# Patient Record
Sex: Male | Born: 1955 | Race: White | Hispanic: No | Marital: Single | Smoking: Never smoker
Health system: Southern US, Community
[De-identification: ages and names within clinical notes are randomized; demographics above are authoritative.]

## PROBLEM LIST (undated history)

## (undated) DIAGNOSIS — I10 Essential (primary) hypertension: Secondary | ICD-10-CM

## (undated) DIAGNOSIS — K219 Gastro-esophageal reflux disease without esophagitis: Secondary | ICD-10-CM

## (undated) HISTORY — PX: COLONOSCOPY: SHX174

## (undated) HISTORY — PX: GANGLION CYST EXCISION: SHX1691

## (undated) HISTORY — PX: PROSTATE BIOPSY: SHX241

---

## 2004-04-13 ENCOUNTER — Ambulatory Visit (HOSPITAL_COMMUNITY): Admission: RE | Admit: 2004-04-13 | Discharge: 2004-04-13 | Payer: Self-pay | Admitting: Gastroenterology

## 2015-01-07 ENCOUNTER — Other Ambulatory Visit: Payer: Self-pay | Admitting: Gastroenterology

## 2015-02-18 ENCOUNTER — Encounter (HOSPITAL_COMMUNITY): Payer: Self-pay | Admitting: *Deleted

## 2015-03-02 ENCOUNTER — Ambulatory Visit (HOSPITAL_COMMUNITY): Payer: Managed Care, Other (non HMO) | Admitting: Anesthesiology

## 2015-03-02 ENCOUNTER — Encounter (HOSPITAL_COMMUNITY)
Admission: RE | Disposition: A | Discharge status: Home / Self Care | Payer: Self-pay | Source: Ambulatory Visit | Attending: Gastroenterology

## 2015-03-02 ENCOUNTER — Ambulatory Visit (HOSPITAL_COMMUNITY)
Admission: RE | Admit: 2015-03-02 | Discharge: 2015-03-02 | Disposition: A | Discharge status: Home / Self Care | Payer: Managed Care, Other (non HMO) | Source: Ambulatory Visit | Attending: Gastroenterology | Admitting: Gastroenterology

## 2015-03-02 ENCOUNTER — Encounter (HOSPITAL_COMMUNITY): Payer: Self-pay | Admitting: *Deleted

## 2015-03-02 DIAGNOSIS — E7800 Pure hypercholesterolemia, unspecified: Secondary | ICD-10-CM | POA: Insufficient documentation

## 2015-03-02 DIAGNOSIS — Z1211 Encounter for screening for malignant neoplasm of colon: Secondary | ICD-10-CM | POA: Insufficient documentation

## 2015-03-02 HISTORY — PX: COLONOSCOPY WITH PROPOFOL: SHX5780

## 2015-03-02 SURGERY — COLONOSCOPY WITH PROPOFOL
Anesthesia: Monitor Anesthesia Care

## 2015-03-02 MED ORDER — SODIUM CHLORIDE 0.9 % IV SOLN: INTRAVENOUS | Status: DC

## 2015-03-02 MED ORDER — PROPOFOL 10 MG/ML IV BOLUS
INTRAVENOUS | Status: AC
Start: 2015-03-02 — End: 2015-03-02
  Filled 2015-03-02: qty 40

## 2015-03-02 MED ORDER — ONDANSETRON HCL 4 MG/2ML IJ SOLN
INTRAMUSCULAR | Status: AC
  Filled 2015-03-02: qty 2

## 2015-03-02 MED ORDER — PROPOFOL 500 MG/50ML IV EMUL
INTRAVENOUS | Status: DC | PRN
  Administered 2015-03-02: 275 ug/kg/min via INTRAVENOUS

## 2015-03-02 MED ORDER — PROPOFOL 10 MG/ML IV BOLUS
INTRAVENOUS | Status: AC
  Filled 2015-03-02: qty 20

## 2015-03-02 MED ORDER — ONDANSETRON HCL 4 MG/2ML IJ SOLN
INTRAMUSCULAR | Status: DC | PRN
  Administered 2015-03-02: 4 mg via INTRAVENOUS

## 2015-03-02 MED ORDER — LACTATED RINGERS IV SOLN
INTRAVENOUS | Status: DC
  Administered 2015-03-02: 1000 mL via INTRAVENOUS

## 2015-03-02 SURGICAL SUPPLY — 21 items

## 2015-03-02 NOTE — Op Note (Signed)
Procedure: Screening colonoscopy. Normal screening colonoscopy performed on 04/13/2004  Endoscopist: Earle Gell  Premedication: Propofol administered by anesthesia  Procedure: The patient was placed in the left lateral decubitus position. Anal inspection and digital rectal exam were normal. The Pentax pediatric colonoscope was introduced into the rectum and advanced to the cecum. A normal-appearing ileocecal valve and appendiceal orifice were identified. Colonic preparation for the exam today was good. Withdrawal time was 15 minutes  Rectum. Normal. Retroflex view of the distal rectum was normal  Sigmoid colon and descending colon. Normal  Splenic flexure. Normal  Transverse colon. Normal  Hepatic flexure. Normal  Ascending colon. Normal  Cecum and ileocecal valve. Normal  Assessment: Normal screening colonoscopy  Recommendation: Schedule repeat screening colonoscopy in 10 years

## 2015-03-02 NOTE — Anesthesia Preprocedure Evaluation (Addendum)
Anesthesia Evaluation  Patient identified by MRN, date of birth, ID band Patient awake    Reviewed: Allergy & Precautions, NPO status , Patient's Chart, lab work & pertinent test results  History of Anesthesia Complications Negative for: history of anesthetic complications  Airway Mallampati: II  TM Distance: >3 FB Neck ROM: Full    Dental  (+) Dental Advisory Given   Pulmonary neg pulmonary ROS,    breath sounds clear to auscultation       Cardiovascular negative cardio ROS   Rhythm:Regular Rate:Normal     Neuro/Psych negative neurological ROS     GI/Hepatic negative GI ROS, Neg liver ROS,   Endo/Other  negative endocrine ROS  Renal/GU negative Renal ROS     Musculoskeletal negative musculoskeletal ROS (+)   Abdominal   Peds  Hematology negative hematology ROS (+)   Anesthesia Other Findings   Reproductive/Obstetrics                             Anesthesia Physical Anesthesia Plan  ASA: I  Anesthesia Plan: MAC   Post-op Pain Management:    Induction: Intravenous  Airway Management Planned: Natural Airway and Simple Face Mask  Additional Equipment:   Intra-op Plan:   Post-operative Plan:   Informed Consent: I have reviewed the patients History and Physical, chart, labs and discussed the procedure including the risks, benefits and alternatives for the proposed anesthesia with the patient or authorized representative who has indicated his/her understanding and acceptance.   Dental advisory given  Plan Discussed with: CRNA and Surgeon  Anesthesia Plan Comments: (Plan routine monitors, MAC)        Anesthesia Quick Evaluation

## 2015-03-02 NOTE — Discharge Instructions (Signed)
Colonoscopy, Care After °Refer to this sheet in the next few weeks. These instructions provide you with information on caring for yourself after your procedure. Your health care provider may also give you more specific instructions. Your treatment has been planned according to current medical practices, but problems sometimes occur. Call your health care provider if you have any problems or questions after your procedure. °WHAT TO EXPECT AFTER THE PROCEDURE  °After your procedure, it is typical to have the following: °· A small amount of blood in your stool. °· Moderate amounts of gas and mild abdominal cramping or bloating. °HOME CARE INSTRUCTIONS °· Do not drive, operate machinery, or sign important documents for 24 hours. °· You may shower and resume your regular physical activities, but move at a slower pace for the first 24 hours. °· Take frequent rest periods for the first 24 hours. °· Walk around or put a warm pack on your abdomen to help reduce abdominal cramping and bloating. °· Drink enough fluids to keep your urine clear or pale yellow. °· You may resume your normal diet as instructed by your health care provider. Avoid heavy or fried foods that are hard to digest. °· Avoid drinking alcohol for 24 hours or as instructed by your health care provider. °· Only take over-the-counter or prescription medicines as directed by your health care provider. °· If a tissue sample (biopsy) was taken during your procedure: °¨ Do not take aspirin or blood thinners for 7 days, or as instructed by your health care provider. °¨ Do not drink alcohol for 7 days, or as instructed by your health care provider. °¨ Eat soft foods for the first 24 hours. °SEEK MEDICAL CARE IF: °You have persistent spotting of blood in your stool 2-3 days after the procedure. °SEEK IMMEDIATE MEDICAL CARE IF: °· You have more than a small spotting of blood in your stool. °· You pass large blood clots in your stool. °· Your abdomen is swollen  (distended). °· You have nausea or vomiting. °· You have a fever. °· You have increasing abdominal pain that is not relieved with medicine. °  °This information is not intended to replace advice given to you by your health care provider. Make sure you discuss any questions you have with your health care provider. °  °Document Released: 08/03/2003 Document Revised: 10/09/2012 Document Reviewed: 08/26/2012 °Elsevier Interactive Patient Education ©2016 [employer] ° °

## 2015-03-02 NOTE — H&P (Signed)
  Procedure: Screening colonoscopy. Normal screening colonoscopy performed on 04/13/2004  History: The patient is a 60 year old male born November 21, 1955. He is scheduled to undergo a repeat screening colonoscopy today.  Medication allergies: Ciprofloxacin caused skin rash. Aspirin caused facial swelling. Aleve cause facial swelling.  Past medical history: Ganglion cyst surgery. Hypercholesterolemia. Elevated PSA requiring diagnostic prostate biopsy.  Exam: The patient is alert and lying comfortably endoscopy stretcher. Abdomen is soft and nontender to palpation. Clear to auscultation. Cardiac exam reveals a regular rhythm.  Plan: Proceed with screening colonoscopy

## 2015-03-02 NOTE — Transfer of Care (Signed)
Immediate Anesthesia Transfer of Care Note  Patient: Evan Calderon  Procedure(s) Performed: Procedure(s): COLONOSCOPY WITH PROPOFOL (N/A)  Patient Location: PACU  Anesthesia Type:MAC  Level of Consciousness: sedated and responds to stimulation,drowsy  Airway & Oxygen Therapy: Patient Spontanous Breathing  Post-op Assessment: Report given to RN  Post vital signs: Reviewed and stable  Last Vitals:  Filed Vitals:   03/02/15 1254 03/02/15 1446  BP: 164/94 90/59  Pulse: 83 68  Temp: 36.7 C   Resp: 22 13    Complications: No apparent anesthesia complications

## 2015-03-02 NOTE — Anesthesia Postprocedure Evaluation (Signed)
Anesthesia Post Note  Patient: Evan Calderon  Procedure(s) Performed: Procedure(s) (LRB): COLONOSCOPY WITH PROPOFOL (N/A)  Patient location during evaluation: Endoscopy Anesthesia Type: MAC Level of consciousness: awake and alert, oriented and patient cooperative Pain management: pain level controlled Vital Signs Assessment: post-procedure vital signs reviewed and stable Respiratory status: spontaneous breathing, nonlabored ventilation and respiratory function stable Cardiovascular status: blood pressure returned to baseline and stable Postop Assessment: no signs of nausea or vomiting Anesthetic complications: no    Last Vitals:  Filed Vitals:   03/02/15 1450 03/02/15 1500  BP: 88/59 111/70  Pulse: 64 56  Temp:    Resp: 12 16    Last Pain: There were no vitals filed for this visit.               Midge Minium

## 2015-03-03 ENCOUNTER — Encounter (HOSPITAL_COMMUNITY): Payer: Self-pay | Admitting: Gastroenterology

## 2017-11-06 ENCOUNTER — Other Ambulatory Visit: Payer: Self-pay | Admitting: Urology

## 2017-11-06 DIAGNOSIS — R972 Elevated prostate specific antigen [PSA]: Secondary | ICD-10-CM

## 2017-11-22 ENCOUNTER — Ambulatory Visit
Admission: RE | Admit: 2017-11-22 | Discharge: 2017-11-22 | Disposition: A | Discharge status: Home / Self Care | Payer: BLUE CROSS/BLUE SHIELD | Source: Ambulatory Visit | Attending: Urology | Admitting: Urology

## 2017-11-22 DIAGNOSIS — R972 Elevated prostate specific antigen [PSA]: Secondary | ICD-10-CM

## 2017-11-22 MED ORDER — GADOBENATE DIMEGLUMINE 529 MG/ML IV SOLN
18.0000 mL | Freq: Once | INTRAVENOUS | Status: AC | PRN
  Administered 2017-11-22: 18 mL via INTRAVENOUS

## 2018-01-29 DIAGNOSIS — Z1159 Encounter for screening for other viral diseases: Secondary | ICD-10-CM | POA: Diagnosis not present

## 2018-01-29 DIAGNOSIS — Z0000 Encounter for general adult medical examination without abnormal findings: Secondary | ICD-10-CM | POA: Diagnosis not present

## 2018-01-29 DIAGNOSIS — E559 Vitamin D deficiency, unspecified: Secondary | ICD-10-CM | POA: Diagnosis not present

## 2018-01-29 DIAGNOSIS — E782 Mixed hyperlipidemia: Secondary | ICD-10-CM | POA: Diagnosis not present

## 2018-01-29 DIAGNOSIS — I10 Essential (primary) hypertension: Secondary | ICD-10-CM | POA: Diagnosis not present

## 2018-01-29 DIAGNOSIS — R009 Unspecified abnormalities of heart beat: Secondary | ICD-10-CM | POA: Diagnosis not present

## 2018-01-29 DIAGNOSIS — Z23 Encounter for immunization: Secondary | ICD-10-CM | POA: Diagnosis not present

## 2018-01-29 DIAGNOSIS — L570 Actinic keratosis: Secondary | ICD-10-CM | POA: Diagnosis not present

## 2018-03-21 ENCOUNTER — Telehealth: Payer: Self-pay

## 2018-03-21 NOTE — Telephone Encounter (Signed)
   Cardiac Questionnaire:    Since your last visit or hospitalization:    1. Have you been having new or worsening chest pain? NO    2. Have you been having new or worsening shortness of breath? NO 3. Have you been having new or worsening leg swelling, wt gain, or increase in abdominal girth (pants fitting more tightly)? NO   4. Have you had any passing out spells? NO    *A YES to any of these questions would result in the appointment being kept. *If all the answers to these questions are NO, we should indicate that given the current situation regarding the worldwide coronarvirus pandemic, at the recommendation of the CDC, we are looking to limit gatherings in our waiting area, and thus will reschedule their appointment beyond four weeks from today.   _____________   [id number] Pre-Screening Questions:  . Do you currently have a fever? NO (yes = cancel and refer to pcp for e-visit) . Have you recently travelled on a cruise, internationally, or to [place], [place], [place], [place], [place], or [place], [place] [employer]) ? YES (yes = cancel, stay home, monitor symptoms, and contact pcp or initiate e-visit if symptoms develop) . Have you been in contact with someone that is currently pending confirmation of Covid19 testing or has been confirmed to have the [place] virus?  NO (yes = cancel, stay home, away from tested individual, monitor symptoms, and contact pcp or initiate e-visit if symptoms develop) . Are you currently experiencing fatigue or cough? NO (yes = pt should be prepared to have a mask placed at the time of their visit).   Appointment Cancelled due to Coronavirus:  Called patient in regards to  f/u appointment with Dr. Irish Lack on 03/28/2018.   Pt states that he is currently in [place]  Patient denies having any chest pain, SOB, cough, fever, or any other Sx.    Pt feels that his heart rate has being fluctuating   He states that his blood pressure has been running in the upper 130's and sometimes  higher   Patient understands to let us know if they develop any Sx before then.

## 2018-03-22 NOTE — Telephone Encounter (Signed)
PATIENT IS CURRENTLY IN [place].  ---Patient needs to be screened prior to any in office visits----  Patient denies having any chest pain, SOB, swelling, wt gain, or increase in abdominal girth, syncope, cough, fever,or any other Sx, or have had contact with anyone known to have been tested.  Patient reported fluctuating HR and HTN on prior phone call below. Called to follow up with patient who states that his irregular HR was in the summer of 2019 and he developed HTN in September 2019. He states that his PCP started him on carvedilol and has titrated it up to 6.25 mg BID. Patient denies irregular or skipped beats at this time. Patient states that he had an EKG that was normal. I do not have notes available for review at this time. Patient states that his BP has been 120-140s/80s and HR in the 70s. Patient also with HLD for which he takes simvastatin since 2002 and does have family hx of CAD. Patient is asymptomatic at this time.   Patient states that he has travel plans that he has already made to go to different places unless they cancel all travel. Made patient aware that we will cancel his appointment and that he stay home, monitor symptoms, and contact pcp for testing. Instructed patient to let us know if his Sx changed or worsened.

## 2018-03-28 ENCOUNTER — Ambulatory Visit: Payer: Medicare Other | Admitting: Interventional Cardiology

## 2018-05-08 DIAGNOSIS — R3912 Poor urinary stream: Secondary | ICD-10-CM | POA: Diagnosis not present

## 2018-05-08 DIAGNOSIS — N401 Enlarged prostate with lower urinary tract symptoms: Secondary | ICD-10-CM | POA: Diagnosis not present

## 2018-05-08 DIAGNOSIS — R972 Elevated prostate specific antigen [PSA]: Secondary | ICD-10-CM | POA: Diagnosis not present

## 2018-06-27 DIAGNOSIS — R972 Elevated prostate specific antigen [PSA]: Secondary | ICD-10-CM | POA: Diagnosis not present

## 2018-07-11 ENCOUNTER — Encounter: Payer: Self-pay | Admitting: Interventional Cardiology

## 2018-07-11 NOTE — Progress Notes (Addendum)
Cardiology Office Note:    Date:  07/12/2018   ID:  Evan Calderon, DOB 1955-04-16, MRN [id number]  PCP:  Evan Cruel, MD  Cardiologist:  No primary care provider on file.   Referring MD: Evan Cruel, MD   Chief Complaint  Patient presents with  . Hypertension  . Coronary Artery Disease  . Advice Only    History of Present Illness:    Evan Calderon is a 63 y.o. male with a hx of hyperlipidemia, elevated BP and is referred for cardiology consultation for possible CAD that could be causing dyspnea.  No diagnosis of heart disease.  But relatively recent identification of hypertension with therapy started.  Father died of myocardial infarction in his 20s.  The patient is noticed decreased exertional tolerance.  He has gained significant weight and is not as physically active as he was previously.  Dating back to 2002 his LDL cholesterol levels were greater than 160.  He has been on statin therapy for greater than 15 years and more recently has run LDL levels less than 100 on simvastatin 20 mg/day.  Blood pressures have been maintained generally less than 150/80 mmHg with most under 784 mmHg systolic.  Before antihypertensive therapy with carvedilol was started, he was concerned about "high heart rates" that would increase to his much is 80 bpm.  He had been more accustomed to having heart rates in the 60s and 70s.  He has never had tachycardia.  He is now on carvedilol 25 mg twice daily.  LDL particle number is 1300 (ideal less than 1000) and small particle concentration is around 750 with ideal being less than 500.  LDL concentration has been in the 90-100 range.  History reviewed. No pertinent past medical history.  Past Surgical History:  Procedure Laterality Date  . COLONOSCOPY    . COLONOSCOPY WITH PROPOFOL N/A 03/02/2015   Procedure: COLONOSCOPY WITH PROPOFOL;  Surgeon: Evan Fair, MD;  Location: [facility];  Service: Endoscopy;  Laterality: N/A;  . GANGLION  CYST EXCISION Left    left wrist  . PROSTATE BIOPSY     biopsy prostate -benign    Current Medications: Current Meds  Medication Sig  . acetaminophen (TYLENOL) 325 MG tablet Take 325 mg by mouth as needed.  . carvedilol (COREG) 25 MG tablet Take 25 mg by mouth 2 (two) times a day.  . Cholecalciferol (VITAMIN D) 2000 units CAPS Take 2,000 Units by mouth daily.  . Multiple Vitamin (MULTIVITAMIN) tablet Take 1 tablet by mouth daily.  . simvastatin (ZOCOR) 20 MG tablet Take 20 mg by mouth daily at 6 PM.  . tamsulosin (FLOMAX) 0.4 MG CAPS capsule Take 0.4 mg by mouth daily.     Allergies:   Aleve [naproxen], Aspirin, Niacin and related, Pravastatin, Salicylates, and Ciprofloxacin   Social History   Socioeconomic History  . Marital status: Single    Spouse name: Not on file  . Number of children: Not on file  . Years of education: Not on file  . Highest education level: Not on file  Occupational History  . Not on file  Social Needs  . Financial resource strain: Not on file  . Food insecurity    Worry: Not on file    Inability: Not on file  . Transportation needs    Medical: Not on file    Non-medical: Not on file  Tobacco Use  . Smoking status: Never Smoker  . Smokeless tobacco: Never Used  Substance and  Sexual Activity  . Alcohol use: Yes    Comment: rare  . Drug use: No  . Sexual activity: Not on file  Lifestyle  . Physical activity    Days per week: Not on file    Minutes per session: Not on file  . Stress: Not on file  Relationships  . Social Herbalist on phone: Not on file    Gets together: Not on file    Attends religious service: Not on file    Active member of club or organization: Not on file    Attends meetings of clubs or organizations: Not on file    Relationship status: Not on file  Other Topics Concern  . Not on file  Social History Narrative  . Not on file     Family History: The patient's family history is not on file.  ROS:    Please see the history of present illness.    Anxiety.  Stress of having to take care of a brother who has von Recklinghausen's disease and immobility.  His brother lives in [place].  The patient is splitting his time between [place].  All other systems reviewed and are negative.  EKGs/Labs/Other Studies Reviewed:    The following studies were reviewed today:  December 2019 lipoprotein analysis: LDL particle #1300; high concentration of small LDL particles.  EKG:  EKG normal sinus rhythm and normal appearance.  Recent Labs: No results found for requested labs within last 8760 hours.  Recent Lipid Panel No results found for: CHOL, TRIG, HDL, CHOLHDL, VLDL, LDLCALC, LDLDIRECT  Physical Exam:    VS:  BP (!) 146/82   Pulse 80   Ht 5\' 8"  (1.727 m)   Wt 211 lb 12.8 oz (96.1 kg)   SpO2 97%   BMI 32.20 kg/m     Wt Readings from Last 3 Encounters:  07/12/18 211 lb 12.8 oz (96.1 kg)  03/02/15 161 lb (73 kg)     GEN: Moderate obesity. No acute distress HEENT: Normal NECK: No JVD. LYMPHATICS: No lymphadenopathy CARDIAC:  RRR without murmur, gallop, or edema. VASCULAR:  Normal Pulses. No bruits. RESPIRATORY:  Clear to auscultation without rales, wheezing or rhonchi  ABDOMEN: Soft, non-tender, non-distended, No pulsatile mass, MUSCULOSKELETAL: No deformity  SKIN: Warm and dry NEUROLOGIC:  Alert and oriented x 3 PSYCHIATRIC:  Normal affect   ASSESSMENT:    1. DOE (dyspnea on exertion)   2. Essential hypertension   3. Hyperlipidemia LDL goal <70    PLAN:    In order of problems listed above:  1. Probable deconditioning.  Encouraged moderate aerobic activity of at least 150 minutes and up to 300 minutes/week.  If difficulty with dyspnea, chest discomfort, or other symptoms that prevent progression, he should contact us and we will consider doing a coronary CTA or a stress function imaging study 2. Blood pressure target should be 130/80 mmHg.  We discussed  weight loss, exercise, low-salt diet.  If he still consistently run systolic blood pressures into the 140s and 150s would add losartan 25 or 50 mg/day.  Both carvedilol and losartan are cardioprotective and with his background history would be appropriate. 3. Agree with Dr. Harrington Calderon that intensification of lipid management to LDL less than 70 is appropriate.  Agree with recommendation that rosuvastatin 20 mg/day be started.   Overall education and awareness concerning primary/secondary risk prevention was discussed in detail: LDL less than 70, hemoglobin A1c less than 7, blood pressure target  less than 130/80 mmHg, >150 minutes of moderate aerobic activity per week, avoidance of smoking, weight control (via diet and exercise), and continued surveillance/management of/for obstructive sleep apnea.  PRN follow-up if symptoms.  Achievement of above targets is important.  He should have a repeat hemoglobin A1c as it was 5.6 several years ago.     Medication Adjustments/Labs and Tests Ordered: Current medicines are reviewed at length with the patient today.  Concerns regarding medicines are outlined above.  Orders Placed This Encounter  Procedures  . EKG 12-Lead   No orders of the defined types were placed in this encounter.   Patient Instructions  Medication Instructions:  1) Dr. Tamala Julian would like for your to speak to Dr. Harrington Calderon about increasing the intensity of your cholesterol medication and blood pressure control.  His recommendations are Rosuvastatin 20mg  once daily and Losartan 25mg  once daily  If you need a refill on your cardiac medications before your next appointment, please call your pharmacy.   Lab work: You need to have your Hemoglobin A1C updated at Dr. Harrington Calderon' office. If you have labs (blood work) drawn today and your tests are completely normal, you will receive your results only by: Marland Kitchen MyChart Message (if you have MyChart) OR . A paper copy in the mail If you have any lab test that is  abnormal or we need to change your treatment, we will call you to review the results.  Testing/Procedures: None  Follow-Up: Your physician recommends that you schedule a follow-up appointment as needed with Dr. Tamala Julian.  Please feel free to contact the office if having chest pain or other cardiac issues.    Any Other Special Instructions Will Be Listed Below (If Applicable).  Your provider recommends that you maintain 150-300 minutes per week of moderate aerobic activity.  Your target blood pressure is 130/80 or lower.  LDL (bad) cholesterol needs to be 70 or lower.      Signed, Sinclair Grooms, MD  07/12/2018 5:24 PM    [place]

## 2018-07-12 ENCOUNTER — Telehealth: Payer: Self-pay | Admitting: Interventional Cardiology

## 2018-07-12 ENCOUNTER — Other Ambulatory Visit: Payer: Self-pay

## 2018-07-12 ENCOUNTER — Ambulatory Visit (INDEPENDENT_AMBULATORY_CARE_PROVIDER_SITE_OTHER): Payer: Medicare Other | Admitting: Interventional Cardiology

## 2018-07-12 ENCOUNTER — Encounter: Payer: Self-pay | Admitting: Interventional Cardiology

## 2018-07-12 VITALS — BP 146/82 | HR 80 | Ht 68.0 in | Wt 211.8 lb

## 2018-07-12 DIAGNOSIS — I10 Essential (primary) hypertension: Secondary | ICD-10-CM

## 2018-07-12 DIAGNOSIS — R0600 Dyspnea, unspecified: Secondary | ICD-10-CM

## 2018-07-12 DIAGNOSIS — E785 Hyperlipidemia, unspecified: Secondary | ICD-10-CM | POA: Diagnosis not present

## 2018-07-12 DIAGNOSIS — R0609 Other forms of dyspnea: Secondary | ICD-10-CM | POA: Diagnosis not present

## 2018-07-12 NOTE — Telephone Encounter (Signed)

## 2018-07-12 NOTE — Patient Instructions (Signed)
Medication Instructions:  1) Dr. Tamala Julian would like for your to speak to Dr. Harrington Challenger about increasing the intensity of your cholesterol medication and blood pressure control.  His recommendations are Rosuvastatin 20mg  once daily and Losartan 25mg  once daily  If you need a refill on your cardiac medications before your next appointment, please call your pharmacy.   Lab work: You need to have your Hemoglobin A1C updated at Dr. Harrington Challenger' office. If you have labs (blood work) drawn today and your tests are completely normal, you will receive your results only by: Marland Kitchen MyChart Message (if you have MyChart) OR . A paper copy in the mail If you have any lab test that is abnormal or we need to change your treatment, we will call you to review the results.  Testing/Procedures: None  Follow-Up: Your physician recommends that you schedule a follow-up appointment as needed with Dr. Tamala Julian.  Please feel free to contact the office if having chest pain or other cardiac issues.    Any Other Special Instructions Will Be Listed Below (If Applicable).  Your provider recommends that you maintain 150-300 minutes per week of moderate aerobic activity.  Your target blood pressure is 130/80 or lower.  LDL (bad) cholesterol needs to be 70 or lower.

## 2018-07-17 DIAGNOSIS — E782 Mixed hyperlipidemia: Secondary | ICD-10-CM | POA: Diagnosis not present

## 2018-07-17 DIAGNOSIS — R7309 Other abnormal glucose: Secondary | ICD-10-CM | POA: Diagnosis not present

## 2018-07-17 DIAGNOSIS — I10 Essential (primary) hypertension: Secondary | ICD-10-CM | POA: Diagnosis not present

## 2018-08-16 ENCOUNTER — Ambulatory Visit: Payer: Medicare Other | Admitting: Interventional Cardiology

## 2018-08-28 DIAGNOSIS — E782 Mixed hyperlipidemia: Secondary | ICD-10-CM | POA: Diagnosis not present

## 2018-08-28 DIAGNOSIS — R7309 Other abnormal glucose: Secondary | ICD-10-CM | POA: Diagnosis not present

## 2018-08-28 DIAGNOSIS — Z0000 Encounter for general adult medical examination without abnormal findings: Secondary | ICD-10-CM | POA: Diagnosis not present

## 2018-08-28 DIAGNOSIS — I10 Essential (primary) hypertension: Secondary | ICD-10-CM | POA: Diagnosis not present

## 2018-08-28 DIAGNOSIS — E559 Vitamin D deficiency, unspecified: Secondary | ICD-10-CM | POA: Diagnosis not present

## 2018-08-28 DIAGNOSIS — Z1159 Encounter for screening for other viral diseases: Secondary | ICD-10-CM | POA: Diagnosis not present

## 2018-08-28 DIAGNOSIS — L570 Actinic keratosis: Secondary | ICD-10-CM | POA: Diagnosis not present

## 2018-08-28 DIAGNOSIS — Z23 Encounter for immunization: Secondary | ICD-10-CM | POA: Diagnosis not present

## 2018-08-28 DIAGNOSIS — R009 Unspecified abnormalities of heart beat: Secondary | ICD-10-CM | POA: Diagnosis not present

## 2018-08-29 DIAGNOSIS — Z23 Encounter for immunization: Secondary | ICD-10-CM | POA: Diagnosis not present

## 2018-10-21 DIAGNOSIS — D2371 Other benign neoplasm of skin of right lower limb, including hip: Secondary | ICD-10-CM | POA: Diagnosis not present

## 2018-10-21 DIAGNOSIS — C44519 Basal cell carcinoma of skin of other part of trunk: Secondary | ICD-10-CM | POA: Diagnosis not present

## 2018-10-21 DIAGNOSIS — D485 Neoplasm of uncertain behavior of skin: Secondary | ICD-10-CM | POA: Diagnosis not present

## 2018-10-21 DIAGNOSIS — L821 Other seborrheic keratosis: Secondary | ICD-10-CM | POA: Diagnosis not present

## 2018-10-21 DIAGNOSIS — L570 Actinic keratosis: Secondary | ICD-10-CM | POA: Diagnosis not present

## 2018-10-21 DIAGNOSIS — D2372 Other benign neoplasm of skin of left lower limb, including hip: Secondary | ICD-10-CM | POA: Diagnosis not present

## 2018-11-01 DIAGNOSIS — R972 Elevated prostate specific antigen [PSA]: Secondary | ICD-10-CM | POA: Diagnosis not present

## 2018-11-13 DIAGNOSIS — N401 Enlarged prostate with lower urinary tract symptoms: Secondary | ICD-10-CM | POA: Diagnosis not present

## 2018-11-13 DIAGNOSIS — R3912 Poor urinary stream: Secondary | ICD-10-CM | POA: Diagnosis not present

## 2018-11-13 DIAGNOSIS — R972 Elevated prostate specific antigen [PSA]: Secondary | ICD-10-CM | POA: Diagnosis not present

## 2019-02-03 ENCOUNTER — Ambulatory Visit: Payer: PRIVATE HEALTH INSURANCE

## 2019-02-14 DIAGNOSIS — E782 Mixed hyperlipidemia: Secondary | ICD-10-CM | POA: Diagnosis not present

## 2019-02-18 DIAGNOSIS — Z0000 Encounter for general adult medical examination without abnormal findings: Secondary | ICD-10-CM | POA: Diagnosis not present

## 2019-02-18 DIAGNOSIS — I10 Essential (primary) hypertension: Secondary | ICD-10-CM | POA: Diagnosis not present

## 2019-02-18 DIAGNOSIS — E782 Mixed hyperlipidemia: Secondary | ICD-10-CM | POA: Diagnosis not present

## 2019-02-19 DIAGNOSIS — R972 Elevated prostate specific antigen [PSA]: Secondary | ICD-10-CM | POA: Diagnosis not present

## 2019-02-26 DIAGNOSIS — R972 Elevated prostate specific antigen [PSA]: Secondary | ICD-10-CM | POA: Diagnosis not present

## 2019-02-26 DIAGNOSIS — N401 Enlarged prostate with lower urinary tract symptoms: Secondary | ICD-10-CM | POA: Diagnosis not present

## 2019-02-26 DIAGNOSIS — R3914 Feeling of incomplete bladder emptying: Secondary | ICD-10-CM | POA: Diagnosis not present

## 2019-02-26 DIAGNOSIS — R351 Nocturia: Secondary | ICD-10-CM | POA: Diagnosis not present

## 2019-04-16 DIAGNOSIS — Z23 Encounter for immunization: Secondary | ICD-10-CM | POA: Diagnosis not present

## 2019-08-27 DIAGNOSIS — R972 Elevated prostate specific antigen [PSA]: Secondary | ICD-10-CM | POA: Diagnosis not present

## 2019-08-27 DIAGNOSIS — N401 Enlarged prostate with lower urinary tract symptoms: Secondary | ICD-10-CM | POA: Diagnosis not present

## 2019-08-27 DIAGNOSIS — R351 Nocturia: Secondary | ICD-10-CM | POA: Diagnosis not present

## 2019-09-25 DIAGNOSIS — Z23 Encounter for immunization: Secondary | ICD-10-CM | POA: Diagnosis not present

## 2019-10-21 DIAGNOSIS — D2271 Melanocytic nevi of right lower limb, including hip: Secondary | ICD-10-CM | POA: Diagnosis not present

## 2019-10-21 DIAGNOSIS — D2272 Melanocytic nevi of left lower limb, including hip: Secondary | ICD-10-CM | POA: Diagnosis not present

## 2019-10-21 DIAGNOSIS — L720 Epidermal cyst: Secondary | ICD-10-CM | POA: Diagnosis not present

## 2019-10-21 DIAGNOSIS — L821 Other seborrheic keratosis: Secondary | ICD-10-CM | POA: Diagnosis not present

## 2019-10-21 DIAGNOSIS — D2371 Other benign neoplasm of skin of right lower limb, including hip: Secondary | ICD-10-CM | POA: Diagnosis not present

## 2019-10-21 DIAGNOSIS — L570 Actinic keratosis: Secondary | ICD-10-CM | POA: Diagnosis not present

## 2019-10-21 DIAGNOSIS — Z85828 Personal history of other malignant neoplasm of skin: Secondary | ICD-10-CM | POA: Diagnosis not present

## 2019-10-21 DIAGNOSIS — D2261 Melanocytic nevi of right upper limb, including shoulder: Secondary | ICD-10-CM | POA: Diagnosis not present

## 2019-11-04 DIAGNOSIS — Z23 Encounter for immunization: Secondary | ICD-10-CM | POA: Diagnosis not present

## 2019-12-11 DIAGNOSIS — R972 Elevated prostate specific antigen [PSA]: Secondary | ICD-10-CM | POA: Diagnosis not present

## 2019-12-15 DIAGNOSIS — R351 Nocturia: Secondary | ICD-10-CM | POA: Diagnosis not present

## 2019-12-15 DIAGNOSIS — R972 Elevated prostate specific antigen [PSA]: Secondary | ICD-10-CM | POA: Diagnosis not present

## 2019-12-15 DIAGNOSIS — N401 Enlarged prostate with lower urinary tract symptoms: Secondary | ICD-10-CM | POA: Diagnosis not present

## 2020-02-19 DIAGNOSIS — Z0000 Encounter for general adult medical examination without abnormal findings: Secondary | ICD-10-CM | POA: Diagnosis not present

## 2020-02-19 DIAGNOSIS — E559 Vitamin D deficiency, unspecified: Secondary | ICD-10-CM | POA: Diagnosis not present

## 2020-02-19 DIAGNOSIS — I10 Essential (primary) hypertension: Secondary | ICD-10-CM | POA: Diagnosis not present

## 2020-02-19 DIAGNOSIS — E782 Mixed hyperlipidemia: Secondary | ICD-10-CM | POA: Diagnosis not present

## 2020-04-14 DIAGNOSIS — Z23 Encounter for immunization: Secondary | ICD-10-CM | POA: Diagnosis not present

## 2020-04-15 DIAGNOSIS — Z20822 Contact with and (suspected) exposure to covid-19: Secondary | ICD-10-CM | POA: Diagnosis not present

## 2020-05-17 DIAGNOSIS — Z20822 Contact with and (suspected) exposure to covid-19: Secondary | ICD-10-CM | POA: Diagnosis not present

## 2020-06-16 DIAGNOSIS — U071 COVID-19: Secondary | ICD-10-CM | POA: Diagnosis not present

## 2020-07-08 DIAGNOSIS — Z20822 Contact with and (suspected) exposure to covid-19: Secondary | ICD-10-CM | POA: Diagnosis not present

## 2020-08-14 DIAGNOSIS — Z20822 Contact with and (suspected) exposure to covid-19: Secondary | ICD-10-CM | POA: Diagnosis not present

## 2020-08-14 IMAGING — MR MR PROSTATE WO/W CM
56 series · 56 of 56 positions shown · IV contrast (Multihance 18ml)
Comparison: None.

CLINICAL DATA: Elevated PSA, most recent 7.3. Recent frequent
urination.

EXAM:
MR PROSTATE WITHOUT AND WITH CONTRAST
TECHNIQUE: Multiplanar multisequence MRI images were obtained of the pelvis
centered about the prostate. Pre and post contrast images were
obtained.
CONTRAST:  18mL MULTIHANCE GADOBENATE DIMEGLUMINE 529 MG/ML IV SOLN

[Series 3: T1 · axial · 8.0mm · 1.06mm/px · 1 of 28 slices shown (1 of 2)]
[im 1/28]
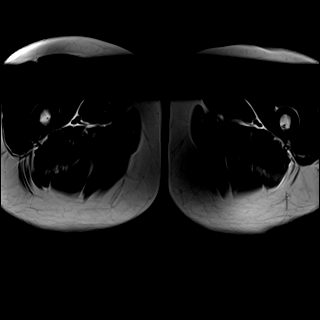

[Series 4: bSSFP fat-sat · axial · 8.0mm · 0.74mm/px · 1 of 28 slices shown]
[im 1/28]
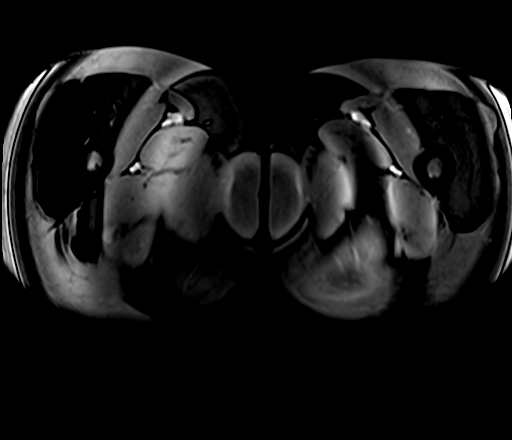

[Series 5: T2 · sagittal · 3.5mm · 0.56mm/px · 1 of 39 slices shown (1 of 4)]
[im 1/39]
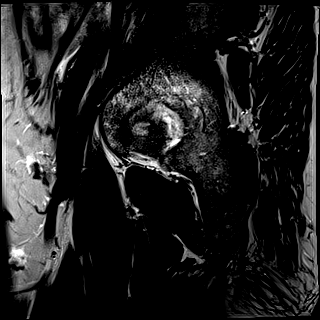

[Series 6: T1 · axial · 3.0mm · 0.31mm/px · 1 of 26 slices shown (2 of 2)]
[im 1/26]
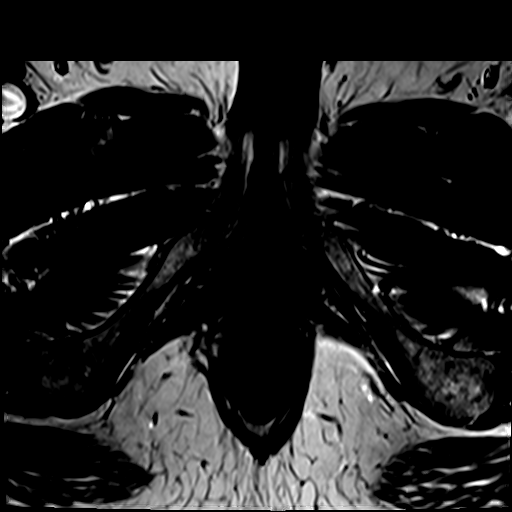

[Series 7: T2 · axial · 3.5mm · 0.56mm/px · 1 of 25 slices shown (2 of 4)]
[im 1/25]
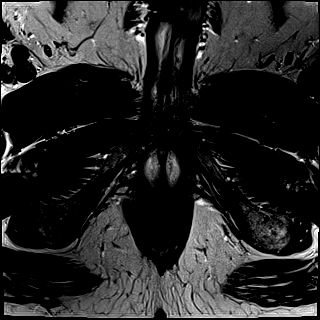

[Series 8: T2 · axial · 1.0mm · 1.04mm/px · 1 of 88 slices shown (3 of 4)]
[im 1/88]
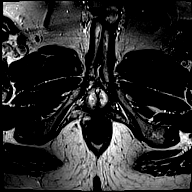

[Series 9: T2 · coronal · 3.5mm · 0.56mm/px · 1 of 23 slices shown (4 of 4)]
[im 1/23]
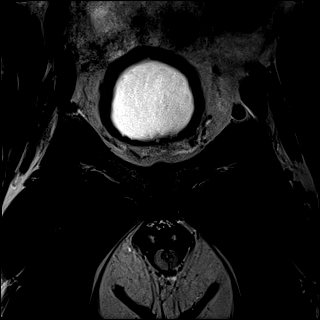

[Series 10: DWI · axial · 3.5mm · 1.56mm/px · 1 of 75 slices shown (1 of 2)]
[im 1/75]
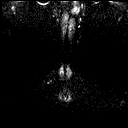

[Series 11: DWI · axial · 3.5mm · 1.56mm/px · 1 of 25 slices shown (2 of 2)]
[im 1/25]
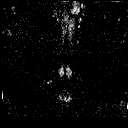

[Series 12: pre t1_twist_tra_dyn_ttc=6.4s · axial · non-contrast · 3.5mm · 0.83mm/px · 1 of 26 slices shown]
[im 1/26]
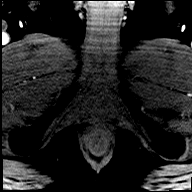

[Series 13: post t1_twist_tra_dyn-copy center · axial · 3.5mm · 0.83mm/px · 1 of 26 slices shown (1 of 24)]
[im 1/26]
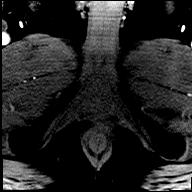

[Series 14: post t1_twist_tra_dyn-copy center · axial · 3.5mm · 0.83mm/px · 1 of 26 slices shown (2 of 24)]
[im 1/26]
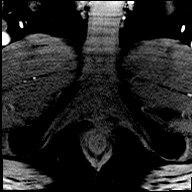

[Series 15: post t1_twist_tra_dyn-copy cent_sub_ttc=(id) · axial · 3.5mm · 0.83mm/px · 1 of 26 slices shown (1 of 22)]
[im 1/26]
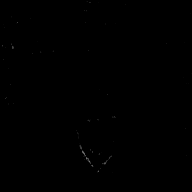

[Series 16: post t1_twist_tra_dyn-copy center · axial · 3.5mm · 0.83mm/px · 1 of 26 slices shown (3 of 24)]
[im 1/26]
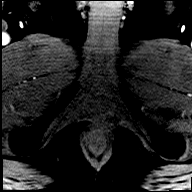

[Series 17: post t1_twist_tra_dyn-copy cent_sub_ttc=(id) · axial · 3.5mm · 0.83mm/px · 1 of 26 slices shown (2 of 22)]
[im 1/26]
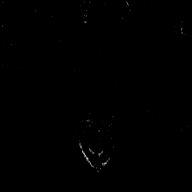

[Series 18: post t1_twist_tra_dyn-copy center · axial · 3.5mm · 0.83mm/px · 1 of 26 slices shown (4 of 24)]
[im 1/26]
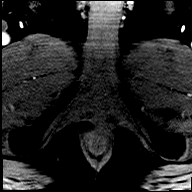

[Series 19: post t1_twist_tra_dyn-copy cent_sub_ttc=(id) · axial · 3.5mm · 0.83mm/px · 1 of 26 slices shown (3 of 22)]
[im 1/26]
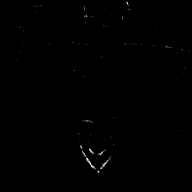

[Series 20: post t1_twist_tra_dyn-copy center · axial · 3.5mm · 0.83mm/px · 1 of 26 slices shown (5 of 24)]
[im 1/26]
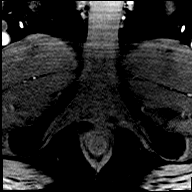

[Series 21: post t1_twist_tra_dyn-copy cent_sub_ttc=(id) · axial · 3.5mm · 0.83mm/px · 1 of 26 slices shown (4 of 22)]
[im 1/26]
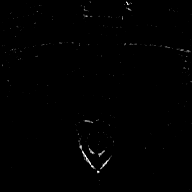

[Series 22: post t1_twist_tra_dyn-copy center · axial · 3.5mm · 0.83mm/px · 1 of 26 slices shown (6 of 24)]
[im 1/26]
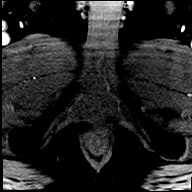

[Series 23: post t1_twist_tra_dyn-copy cent_sub_ttc=(id) · axial · 3.5mm · 0.83mm/px · 1 of 26 slices shown (5 of 22)]
[im 1/26]
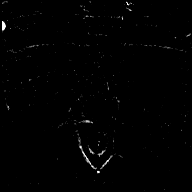

[Series 24: post t1_twist_tra_dyn-copy center · axial · 3.5mm · 0.83mm/px · 1 of 26 slices shown (7 of 24)]
[im 1/26]
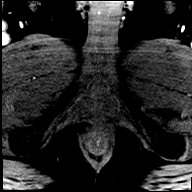

[Series 25: post t1_twist_tra_dyn-copy cent_sub_ttc=(id) · axial · 3.5mm · 0.83mm/px · 1 of 26 slices shown (6 of 22)]
[im 1/26]
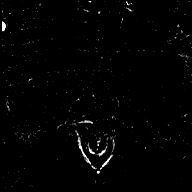

[Series 26: post t1_twist_tra_dyn-copy center · axial · 3.5mm · 0.83mm/px · 1 of 26 slices shown (8 of 24)]
[im 1/26]
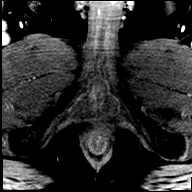

[Series 27: post t1_twist_tra_dyn-copy cent_sub_ttc=(id) · axial · 3.5mm · 0.83mm/px · 1 of 26 slices shown (7 of 22)]
[im 1/26]
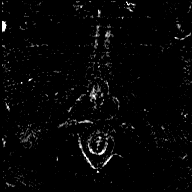

[Series 28: post t1_twist_tra_dyn-copy center · axial · 3.5mm · 0.83mm/px · 1 of 26 slices shown (9 of 24)]
[im 1/26]
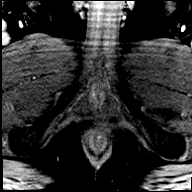

[Series 29: post t1_twist_tra_dyn-copy cent_sub_ttc=(id) · axial · 3.5mm · 0.83mm/px · 1 of 26 slices shown (8 of 22)]
[im 1/26]
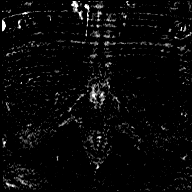

[Series 30: post t1_twist_tra_dyn-copy center · axial · 3.5mm · 0.83mm/px · 1 of 26 slices shown (10 of 24)]
[im 1/26]
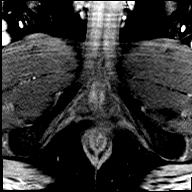

[Series 31: post t1_twist_tra_dyn-copy cent_sub_ttc=(id) · axial · 3.5mm · 0.83mm/px · 1 of 26 slices shown (9 of 22)]
[im 1/26]
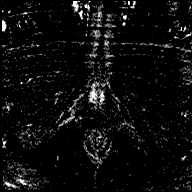

[Series 32: post t1_twist_tra_dyn-copy center · axial · 3.5mm · 0.83mm/px · 1 of 26 slices shown (11 of 24)]
[im 1/26]
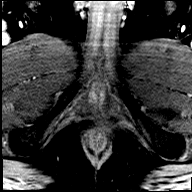

[Series 33: post t1_twist_tra_dyn-copy cent_sub_ttc=(id) · axial · 3.5mm · 0.83mm/px · 1 of 26 slices shown (10 of 22)]
[im 1/26]
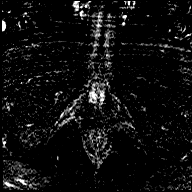

[Series 34: post t1_twist_tra_dyn-copy center · axial · 3.5mm · 0.83mm/px · 1 of 26 slices shown (12 of 24)]
[im 1/26]
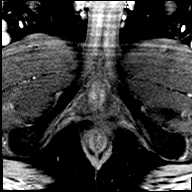

[Series 35: post t1_twist_tra_dyn-copy cent_sub_ttc=(id) · axial · 3.5mm · 0.83mm/px · 1 of 26 slices shown (11 of 22)]
[im 1/26]
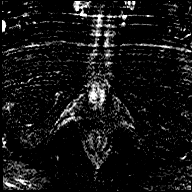

[Series 36: post t1_twist_tra_dyn-copy center · axial · 3.5mm · 0.83mm/px · 1 of 26 slices shown (13 of 24)]
[im 1/26]
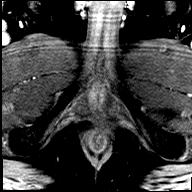

[Series 37: post t1_twist_tra_dyn-copy cent_sub_ttc=(id) · axial · 3.5mm · 0.83mm/px · 1 of 26 slices shown (12 of 22)]
[im 1/26]
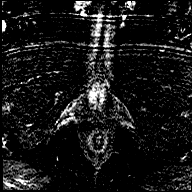

[Series 38: post t1_twist_tra_dyn-copy center · axial · 3.5mm · 0.83mm/px · 1 of 26 slices shown (14 of 24)]
[im 1/26]
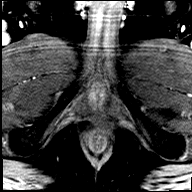

[Series 39: post t1_twist_tra_dyn-copy cent_sub_ttc=(id) · axial · 3.5mm · 0.83mm/px · 1 of 26 slices shown (13 of 22)]
[im 1/26]
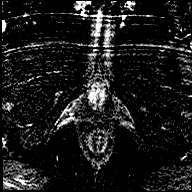

[Series 40: post t1_twist_tra_dyn-copy center · axial · 3.5mm · 0.83mm/px · 1 of 26 slices shown (15 of 24)]
[im 1/26]
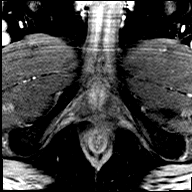

[Series 41: post t1_twist_tra_dyn-copy cent_sub_ttc=(id) · axial · 3.5mm · 0.83mm/px · 1 of 26 slices shown (14 of 22)]
[im 1/26]
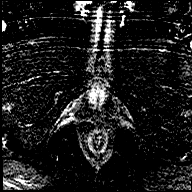

[Series 42: post t1_twist_tra_dyn-copy center · axial · 3.5mm · 0.83mm/px · 1 of 26 slices shown (16 of 24)]
[im 1/26]
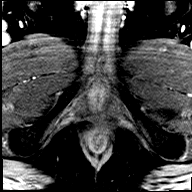

[Series 43: post t1_twist_tra_dyn-copy cent_sub_ttc=(id) · axial · 3.5mm · 0.83mm/px · 1 of 26 slices shown (15 of 22)]
[im 1/26]
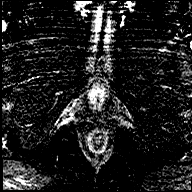

[Series 44: post t1_twist_tra_dyn-copy center · axial · 3.5mm · 0.83mm/px · 1 of 26 slices shown (17 of 24)]
[im 1/26]
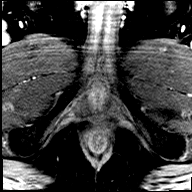

[Series 45: post t1_twist_tra_dyn-copy cent_sub_ttc=(id) · axial · 3.5mm · 0.83mm/px · 1 of 26 slices shown (16 of 22)]
[im 1/26]
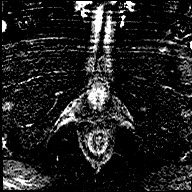

[Series 46: post t1_twist_tra_dyn-copy center · axial · 3.5mm · 0.83mm/px · 1 of 26 slices shown (18 of 24)]
[im 1/26]
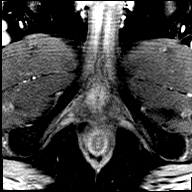

[Series 47: post t1_twist_tra_dyn-copy cent_sub_ttc=(id) · axial · 3.5mm · 0.83mm/px · 1 of 26 slices shown (17 of 22)]
[im 1/26]
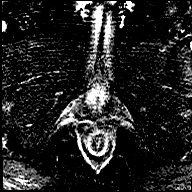

[Series 48: post t1_twist_tra_dyn-copy center · axial · 3.5mm · 0.83mm/px · 1 of 26 slices shown (19 of 24)]
[im 1/26]
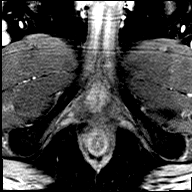

[Series 49: post t1_twist_tra_dyn-copy cent_sub_ttc=(id) · axial · 3.5mm · 0.83mm/px · 1 of 26 slices shown (18 of 22)]
[im 1/26]
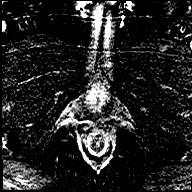

[Series 50: post t1_twist_tra_dyn-copy center · axial · 3.5mm · 0.83mm/px · 1 of 26 slices shown (20 of 24)]
[im 1/26]
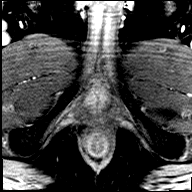

[Series 51: post t1_twist_tra_dyn-copy cent_sub_ttc=(id) · axial · 3.5mm · 0.83mm/px · 1 of 26 slices shown (19 of 22)]
[im 1/26]
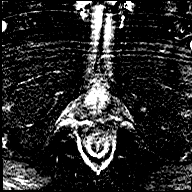

[Series 52: post t1_twist_tra_dyn-copy center · axial · 3.5mm · 0.83mm/px · 1 of 26 slices shown (21 of 24)]
[im 1/26]
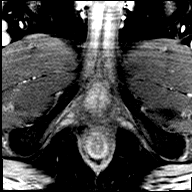

[Series 53: post t1_twist_tra_dyn-copy cent_sub_ttc=(id) · axial · 3.5mm · 0.83mm/px · 1 of 26 slices shown (20 of 22)]
[im 1/26]
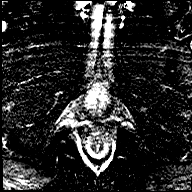

[Series 54: post t1_twist_tra_dyn-copy center · axial · 3.5mm · 0.83mm/px · 1 of 26 slices shown (22 of 24)]
[im 1/26]
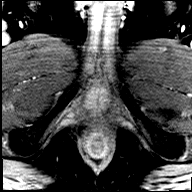

[Series 55: post t1_twist_tra_dyn-copy cent_sub_ttc=(id) · axial · 3.5mm · 0.83mm/px · 1 of 26 slices shown (21 of 22)]
[im 1/26]
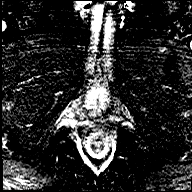

[Series 56: post t1_twist_tra_dyn-copy center · axial · 3.5mm · 0.83mm/px · 1 of 26 slices shown (23 of 24)]
[im 1/26]
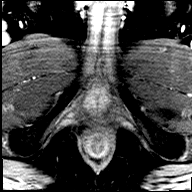

[Series 57: post t1_twist_tra_dyn-copy cent_sub_ttc=(id) · axial · 3.5mm · 0.83mm/px · 1 of 26 slices shown (22 of 22)]
[im 1/26]
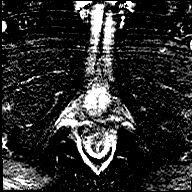

[Series 58: post t1_twist_tra_dyn-copy center · axial · 3.5mm · 0.83mm/px · 1 of 26 slices shown (24 of 24)]
[im 1/26]
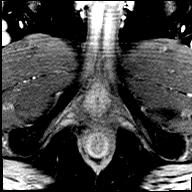

[56 of 56 positions shown; findings below may reference images not displayed]

FINDINGS: Prostate: Demonstrates moderate central gland enlargement and
heterogeneity, consistent with benign prostatic hyperplasia. No
suspicious or dominant central gland nodule. Median lobe impression
into the urinary bladder. The peripheral zone is compressed, without
masslike T2 hypointensity or restricted diffusion. Postcontrast
imaging demonstrates bilateral mid gland peripheral zone early
post-contrast enhancement on series 24.

Volume: 5.0 x 5.5 x 5.0 cm (volume = 72 cm^3)

Transcapsular spread:  Absent

Seminal vesicle involvement: Absent

Neurovascular bundle involvement: Absent

Pelvic adenopathy: Absent

Bone metastasis: Absent

Other findings: No significant free fluid.  Normal urinary bladder.
IMPRESSION: 1. No findings of macroscopic or high-grade prostate carcinoma.
2. Moderate benign prostatic hyperplasia, with compression of the
central gland.
3. Nonspecific early post-contrast enhancement within the mid gland
peripheral zone bilaterally.

## 2020-09-21 DIAGNOSIS — R1011 Right upper quadrant pain: Secondary | ICD-10-CM | POA: Diagnosis not present

## 2020-09-24 DIAGNOSIS — R109 Unspecified abdominal pain: Secondary | ICD-10-CM | POA: Diagnosis not present

## 2020-10-07 ENCOUNTER — Other Ambulatory Visit (HOSPITAL_COMMUNITY): Payer: Self-pay | Admitting: Family Medicine

## 2020-10-07 DIAGNOSIS — R1013 Epigastric pain: Secondary | ICD-10-CM

## 2020-10-07 DIAGNOSIS — R1011 Right upper quadrant pain: Secondary | ICD-10-CM

## 2020-10-11 DIAGNOSIS — Z23 Encounter for immunization: Secondary | ICD-10-CM | POA: Diagnosis not present

## 2020-10-15 ENCOUNTER — Ambulatory Visit (HOSPITAL_COMMUNITY)
Admission: RE | Admit: 2020-10-15 | Discharge: 2020-10-15 | Disposition: A | Discharge status: Home / Self Care | Payer: Medicare Other | Source: Ambulatory Visit | Attending: Family Medicine | Admitting: Family Medicine

## 2020-10-15 DIAGNOSIS — R1011 Right upper quadrant pain: Secondary | ICD-10-CM | POA: Diagnosis not present

## 2020-10-15 DIAGNOSIS — R1013 Epigastric pain: Secondary | ICD-10-CM | POA: Insufficient documentation

## 2020-10-15 MED ORDER — TECHNETIUM TC 99M MEBROFENIN IV KIT
5.3000 | PACK | Freq: Once | INTRAVENOUS | Status: AC | PRN
  Administered 2020-10-15: 5.3 via INTRAVENOUS

## 2020-10-20 DIAGNOSIS — D225 Melanocytic nevi of trunk: Secondary | ICD-10-CM | POA: Diagnosis not present

## 2020-10-20 DIAGNOSIS — Z85828 Personal history of other malignant neoplasm of skin: Secondary | ICD-10-CM | POA: Diagnosis not present

## 2020-10-20 DIAGNOSIS — L570 Actinic keratosis: Secondary | ICD-10-CM | POA: Diagnosis not present

## 2020-10-20 DIAGNOSIS — L821 Other seborrheic keratosis: Secondary | ICD-10-CM | POA: Diagnosis not present

## 2020-10-20 DIAGNOSIS — L820 Inflamed seborrheic keratosis: Secondary | ICD-10-CM | POA: Diagnosis not present

## 2020-11-06 DIAGNOSIS — U071 COVID-19: Secondary | ICD-10-CM | POA: Diagnosis not present

## 2020-11-12 ENCOUNTER — Other Ambulatory Visit: Payer: Self-pay | Admitting: Urology

## 2020-12-06 ENCOUNTER — Other Ambulatory Visit: Payer: Self-pay | Admitting: Urology

## 2020-12-06 DIAGNOSIS — R972 Elevated prostate specific antigen [PSA]: Secondary | ICD-10-CM | POA: Diagnosis not present

## 2020-12-08 DIAGNOSIS — K219 Gastro-esophageal reflux disease without esophagitis: Secondary | ICD-10-CM | POA: Diagnosis not present

## 2020-12-08 DIAGNOSIS — R1011 Right upper quadrant pain: Secondary | ICD-10-CM | POA: Diagnosis not present

## 2020-12-08 DIAGNOSIS — R194 Change in bowel habit: Secondary | ICD-10-CM | POA: Diagnosis not present

## 2020-12-08 DIAGNOSIS — R140 Abdominal distension (gaseous): Secondary | ICD-10-CM | POA: Diagnosis not present

## 2020-12-10 DIAGNOSIS — Z20822 Contact with and (suspected) exposure to covid-19: Secondary | ICD-10-CM | POA: Diagnosis not present

## 2020-12-13 DIAGNOSIS — R351 Nocturia: Secondary | ICD-10-CM | POA: Diagnosis not present

## 2020-12-13 DIAGNOSIS — N401 Enlarged prostate with lower urinary tract symptoms: Secondary | ICD-10-CM | POA: Diagnosis not present

## 2020-12-13 DIAGNOSIS — R972 Elevated prostate specific antigen [PSA]: Secondary | ICD-10-CM | POA: Diagnosis not present

## 2021-01-29 DIAGNOSIS — Z20822 Contact with and (suspected) exposure to covid-19: Secondary | ICD-10-CM | POA: Diagnosis not present

## 2021-01-31 DIAGNOSIS — R1011 Right upper quadrant pain: Secondary | ICD-10-CM | POA: Diagnosis not present

## 2021-01-31 DIAGNOSIS — R140 Abdominal distension (gaseous): Secondary | ICD-10-CM | POA: Diagnosis not present

## 2021-02-21 DIAGNOSIS — E559 Vitamin D deficiency, unspecified: Secondary | ICD-10-CM | POA: Diagnosis not present

## 2021-02-21 DIAGNOSIS — E782 Mixed hyperlipidemia: Secondary | ICD-10-CM | POA: Diagnosis not present

## 2021-02-21 DIAGNOSIS — I10 Essential (primary) hypertension: Secondary | ICD-10-CM | POA: Diagnosis not present

## 2021-02-24 DIAGNOSIS — I10 Essential (primary) hypertension: Secondary | ICD-10-CM | POA: Diagnosis not present

## 2021-02-24 DIAGNOSIS — E782 Mixed hyperlipidemia: Secondary | ICD-10-CM | POA: Diagnosis not present

## 2021-02-24 DIAGNOSIS — E559 Vitamin D deficiency, unspecified: Secondary | ICD-10-CM | POA: Diagnosis not present

## 2021-02-24 DIAGNOSIS — Z0000 Encounter for general adult medical examination without abnormal findings: Secondary | ICD-10-CM | POA: Diagnosis not present

## 2021-04-03 DIAGNOSIS — Z20822 Contact with and (suspected) exposure to covid-19: Secondary | ICD-10-CM | POA: Diagnosis not present

## 2021-04-21 DIAGNOSIS — K219 Gastro-esophageal reflux disease without esophagitis: Secondary | ICD-10-CM | POA: Diagnosis not present

## 2021-04-21 DIAGNOSIS — R634 Abnormal weight loss: Secondary | ICD-10-CM | POA: Diagnosis not present

## 2021-04-21 DIAGNOSIS — R1011 Right upper quadrant pain: Secondary | ICD-10-CM | POA: Diagnosis not present

## 2021-04-22 ENCOUNTER — Other Ambulatory Visit: Payer: Self-pay | Admitting: Gastroenterology

## 2021-04-22 DIAGNOSIS — R1011 Right upper quadrant pain: Secondary | ICD-10-CM

## 2021-04-22 DIAGNOSIS — K219 Gastro-esophageal reflux disease without esophagitis: Secondary | ICD-10-CM

## 2021-05-05 DIAGNOSIS — U071 COVID-19: Secondary | ICD-10-CM | POA: Diagnosis not present

## 2021-05-05 DIAGNOSIS — Z23 Encounter for immunization: Secondary | ICD-10-CM | POA: Diagnosis not present

## 2021-05-13 ENCOUNTER — Ambulatory Visit
Admission: RE | Admit: 2021-05-13 | Discharge: 2021-05-13 | Disposition: A | Discharge status: Home / Self Care | Payer: Medicare Other | Source: Ambulatory Visit | Attending: Gastroenterology | Admitting: Gastroenterology

## 2021-05-13 DIAGNOSIS — N281 Cyst of kidney, acquired: Secondary | ICD-10-CM | POA: Diagnosis not present

## 2021-05-13 DIAGNOSIS — R1011 Right upper quadrant pain: Secondary | ICD-10-CM

## 2021-05-13 DIAGNOSIS — N2889 Other specified disorders of kidney and ureter: Secondary | ICD-10-CM | POA: Diagnosis not present

## 2021-05-13 DIAGNOSIS — K219 Gastro-esophageal reflux disease without esophagitis: Secondary | ICD-10-CM

## 2021-05-13 DIAGNOSIS — K5730 Diverticulosis of large intestine without perforation or abscess without bleeding: Secondary | ICD-10-CM | POA: Diagnosis not present

## 2021-05-13 DIAGNOSIS — K7689 Other specified diseases of liver: Secondary | ICD-10-CM | POA: Diagnosis not present

## 2021-05-13 MED ORDER — IOPAMIDOL (ISOVUE-300) INJECTION 61%
100.0000 mL | Freq: Once | INTRAVENOUS | Status: AC | PRN
  Administered 2021-05-13: 100 mL via INTRAVENOUS

## 2021-06-01 ENCOUNTER — Ambulatory Visit (HOSPITAL_COMMUNITY)
Admission: RE | Admit: 2021-06-01 | Discharge: 2021-06-01 | Disposition: A | Discharge status: Home / Self Care | Payer: Medicare Other | Source: Ambulatory Visit | Attending: Urology | Admitting: Urology

## 2021-06-01 ENCOUNTER — Other Ambulatory Visit (HOSPITAL_COMMUNITY): Payer: Self-pay | Admitting: Urology

## 2021-06-01 DIAGNOSIS — D49512 Neoplasm of unspecified behavior of left kidney: Secondary | ICD-10-CM

## 2021-06-01 DIAGNOSIS — R972 Elevated prostate specific antigen [PSA]: Secondary | ICD-10-CM | POA: Diagnosis not present

## 2021-06-01 DIAGNOSIS — C649 Malignant neoplasm of unspecified kidney, except renal pelvis: Secondary | ICD-10-CM | POA: Diagnosis not present

## 2021-06-09 ENCOUNTER — Other Ambulatory Visit: Payer: Self-pay | Admitting: Urology

## 2021-07-20 DIAGNOSIS — K219 Gastro-esophageal reflux disease without esophagitis: Secondary | ICD-10-CM | POA: Diagnosis not present

## 2021-07-20 DIAGNOSIS — K580 Irritable bowel syndrome with diarrhea: Secondary | ICD-10-CM | POA: Diagnosis not present

## 2021-07-20 DIAGNOSIS — R634 Abnormal weight loss: Secondary | ICD-10-CM | POA: Diagnosis not present

## 2021-07-20 DIAGNOSIS — N2889 Other specified disorders of kidney and ureter: Secondary | ICD-10-CM | POA: Diagnosis not present

## 2021-07-20 DIAGNOSIS — R1011 Right upper quadrant pain: Secondary | ICD-10-CM | POA: Diagnosis not present

## 2021-07-21 DIAGNOSIS — L255 Unspecified contact dermatitis due to plants, except food: Secondary | ICD-10-CM | POA: Diagnosis not present

## 2021-09-19 DIAGNOSIS — D49512 Neoplasm of unspecified behavior of left kidney: Secondary | ICD-10-CM | POA: Diagnosis not present

## 2021-09-21 DIAGNOSIS — N281 Cyst of kidney, acquired: Secondary | ICD-10-CM | POA: Diagnosis not present

## 2021-09-21 DIAGNOSIS — N2889 Other specified disorders of kidney and ureter: Secondary | ICD-10-CM | POA: Diagnosis not present

## 2021-09-21 DIAGNOSIS — D49512 Neoplasm of unspecified behavior of left kidney: Secondary | ICD-10-CM | POA: Diagnosis not present

## 2021-09-21 DIAGNOSIS — K5730 Diverticulosis of large intestine without perforation or abscess without bleeding: Secondary | ICD-10-CM | POA: Diagnosis not present

## 2021-09-21 DIAGNOSIS — R634 Abnormal weight loss: Secondary | ICD-10-CM | POA: Diagnosis not present

## 2021-10-11 DIAGNOSIS — D49512 Neoplasm of unspecified behavior of left kidney: Secondary | ICD-10-CM | POA: Diagnosis not present

## 2021-10-13 DIAGNOSIS — Z23 Encounter for immunization: Secondary | ICD-10-CM | POA: Diagnosis not present

## 2021-10-25 ENCOUNTER — Encounter (HOSPITAL_COMMUNITY): Payer: Medicare Other

## 2021-10-28 ENCOUNTER — Ambulatory Visit: Admit: 2021-10-28 | Payer: Medicare Other | Admitting: Urology

## 2021-10-28 SURGERY — NEPHRECTOMY, PARTIAL, ROBOT-ASSISTED
Anesthesia: General | Laterality: Left

## 2021-11-11 ENCOUNTER — Other Ambulatory Visit: Payer: Self-pay | Admitting: Urology

## 2021-11-16 DIAGNOSIS — L570 Actinic keratosis: Secondary | ICD-10-CM | POA: Diagnosis not present

## 2021-11-16 DIAGNOSIS — D485 Neoplasm of uncertain behavior of skin: Secondary | ICD-10-CM | POA: Diagnosis not present

## 2021-11-16 DIAGNOSIS — Z85828 Personal history of other malignant neoplasm of skin: Secondary | ICD-10-CM | POA: Diagnosis not present

## 2021-11-16 DIAGNOSIS — L821 Other seborrheic keratosis: Secondary | ICD-10-CM | POA: Diagnosis not present

## 2021-11-16 DIAGNOSIS — D225 Melanocytic nevi of trunk: Secondary | ICD-10-CM | POA: Diagnosis not present

## 2021-11-17 DIAGNOSIS — N401 Enlarged prostate with lower urinary tract symptoms: Secondary | ICD-10-CM | POA: Diagnosis not present

## 2021-11-17 DIAGNOSIS — R972 Elevated prostate specific antigen [PSA]: Secondary | ICD-10-CM | POA: Diagnosis not present

## 2021-11-17 DIAGNOSIS — R351 Nocturia: Secondary | ICD-10-CM | POA: Diagnosis not present

## 2021-11-17 DIAGNOSIS — D49512 Neoplasm of unspecified behavior of left kidney: Secondary | ICD-10-CM | POA: Diagnosis not present

## 2021-11-18 DIAGNOSIS — R1011 Right upper quadrant pain: Secondary | ICD-10-CM | POA: Diagnosis not present

## 2021-11-18 DIAGNOSIS — N2889 Other specified disorders of kidney and ureter: Secondary | ICD-10-CM | POA: Diagnosis not present

## 2021-11-18 DIAGNOSIS — K580 Irritable bowel syndrome with diarrhea: Secondary | ICD-10-CM | POA: Diagnosis not present

## 2021-11-18 DIAGNOSIS — K219 Gastro-esophageal reflux disease without esophagitis: Secondary | ICD-10-CM | POA: Diagnosis not present

## 2021-11-18 DIAGNOSIS — R634 Abnormal weight loss: Secondary | ICD-10-CM | POA: Diagnosis not present

## 2021-12-07 DIAGNOSIS — R972 Elevated prostate specific antigen [PSA]: Secondary | ICD-10-CM | POA: Diagnosis not present

## 2021-12-14 DIAGNOSIS — R972 Elevated prostate specific antigen [PSA]: Secondary | ICD-10-CM | POA: Diagnosis not present

## 2021-12-14 DIAGNOSIS — R351 Nocturia: Secondary | ICD-10-CM | POA: Diagnosis not present

## 2021-12-14 DIAGNOSIS — N401 Enlarged prostate with lower urinary tract symptoms: Secondary | ICD-10-CM | POA: Diagnosis not present

## 2022-01-05 ENCOUNTER — Other Ambulatory Visit: Payer: Self-pay | Admitting: Urology

## 2022-02-27 DIAGNOSIS — E559 Vitamin D deficiency, unspecified: Secondary | ICD-10-CM | POA: Diagnosis not present

## 2022-02-27 DIAGNOSIS — I10 Essential (primary) hypertension: Secondary | ICD-10-CM | POA: Diagnosis not present

## 2022-02-27 DIAGNOSIS — E782 Mixed hyperlipidemia: Secondary | ICD-10-CM | POA: Diagnosis not present

## 2022-03-02 DIAGNOSIS — I10 Essential (primary) hypertension: Secondary | ICD-10-CM | POA: Diagnosis not present

## 2022-03-02 DIAGNOSIS — R7301 Impaired fasting glucose: Secondary | ICD-10-CM | POA: Diagnosis not present

## 2022-03-02 DIAGNOSIS — Z0000 Encounter for general adult medical examination without abnormal findings: Secondary | ICD-10-CM | POA: Diagnosis not present

## 2022-03-02 DIAGNOSIS — Z6826 Body mass index (BMI) 26.0-26.9, adult: Secondary | ICD-10-CM | POA: Diagnosis not present

## 2022-03-02 DIAGNOSIS — E559 Vitamin D deficiency, unspecified: Secondary | ICD-10-CM | POA: Diagnosis not present

## 2022-03-02 DIAGNOSIS — E782 Mixed hyperlipidemia: Secondary | ICD-10-CM | POA: Diagnosis not present

## 2022-03-14 DIAGNOSIS — D49512 Neoplasm of unspecified behavior of left kidney: Secondary | ICD-10-CM | POA: Diagnosis not present

## 2022-03-14 NOTE — Progress Notes (Signed)
Anesthesia Review:  PCP: Cardiologist : Chest x-ray : 06/02/21- 2 view  EKG : Echo : Stress test: Cardiac Cath :  Activity level:  Sleep Study/ CPAP : Fasting Blood Sugar :      / Checks Blood Sugar -- times a day:   Blood Thinner/ Instructions /Last Dose: ASA / Instructions/ Last Dose :

## 2022-03-14 NOTE — Patient Instructions (Signed)
SURGICAL WAITING ROOM VISITATION  Patients having surgery or a procedure may have no more than 2 support people in the waiting area - these visitors may rotate.    Children under the age of 26 must have an adult with them who is not the patient.  Due to an increase in RSV and influenza rates and associated hospitalizations, children ages 19 and under may not visit patients in [place].  If the patient needs to stay at the hospital during part of their recovery, the visitor guidelines for inpatient rooms apply. Pre-op nurse will coordinate an appropriate time for 1 support person to accompany patient in pre-op.  This support person may not rotate.    Please refer to the [facility] website for the visitor guidelines for Inpatients (after your surgery is over and you are in a regular room).       Your procedure is scheduled on:  03/26/24    Report to [facility] Main Entrance    Report to admitting at  [place] AM   Call this number if you have problems the morning of surgery [phone]   Do not eat food  or drink liquids :After Midnight.            Oral Hygiene is also important to reduce your risk of infection.                                    Remember - BRUSH YOUR TEETH THE MORNING OF SURGERY WITH YOUR REGULAR TOOTHPASTE  DENTURES WILL BE REMOVED PRIOR TO SURGERY PLEASE DO NOT APPLY "Poly grip" OR ADHESIVES!!!   Do NOT smoke after Midnight   Take these medicines the morning of surgery with A SIP OF WATER:    DO NOT TAKE ANY ORAL DIABETIC MEDICATIONS DAY OF YOUR SURGERY  Bring CPAP mask and tubing day of surgery.                              You may not have any metal on your body including hair pins, jewelry, and body piercing             Do not wear make-up, lotions, powders, perfumes/cologne, or deodorant  Do not wear nail polish including gel and S&S, artificial/acrylic nails, or any other type of covering on natural nails including finger and  toenails. If you have artificial nails, gel coating, etc. that needs to be removed by a nail salon please have this removed prior to surgery or surgery may need to be canceled/ delayed if the surgeon/ anesthesia feels like they are unable to be safely monitored.   Do not shave  48 hours prior to surgery.               Men may shave face and neck.   Do not bring valuables to the hospital. [place].   Contacts, glasses, dentures or bridgework may not be worn into surgery.   Bring small overnight bag day of surgery.   DO NOT [place]. PHARMACY WILL DISPENSE MEDICATIONS LISTED ON YOUR MEDICATION LIST TO YOU DURING YOUR ADMISSION [place]!    Patients discharged on the day of surgery will not be allowed to drive home.  Someone NEEDS to stay with you for the first 24 hours after anesthesia.   Special Instructions: Bring a copy of your healthcare power of attorney and living will documents the day of surgery if you haven't scanned them before.              Please read over the following fact sheets you were given: IF [place] [phone]   If you received a COVID test during your pre-op visit  it is requested that you wear a mask when out in public, stay away from anyone that may not be feeling well and notify your surgeon if you develop symptoms. If you test positive for Covid or have been in contact with anyone that has tested positive in the last 10 days please notify you surgeon.    [place] - Preparing for Surgery Before surgery, you can play an important role.  Because skin is not sterile, your skin needs to be as free of germs as possible.  You can reduce the number of germs on your skin by washing with CHG (chlorahexidine gluconate) soap before surgery.  CHG is an antiseptic cleaner which kills germs and bonds with the skin to continue killing  germs even after washing. Please DO NOT use if you have an allergy to CHG or antibacterial soaps.  If your skin becomes reddened/irritated stop using the CHG and inform your nurse when you arrive at Short Stay. Do not shave (including legs and underarms) for at least 48 hours prior to the first CHG shower.  You may shave your face/neck. Please follow these instructions carefully:  1.  Shower with CHG Soap the night before surgery and the  morning of Surgery.  2.  If you choose to wash your hair, wash your hair first as usual with your  normal  shampoo.  3.  After you shampoo, rinse your hair and body thoroughly to remove the  shampoo.                           4.  Use CHG as you would any other liquid soap.  You can apply chg directly  to the skin and wash                       Gently with a scrungie or clean washcloth.  5.  Apply the CHG Soap to your body ONLY FROM THE NECK DOWN.   Do not use on face/ open                           Wound or open sores. Avoid contact with eyes, ears mouth and genitals (private parts).                       Wash face,  Genitals (private parts) with your normal soap.             6.  Wash thoroughly, paying special attention to the area where your surgery  will be performed.  7.  Thoroughly rinse your body with warm water from the neck down.  8.  DO NOT shower/wash with your normal soap after using and rinsing off  the CHG Soap.                9.  Pat yourself dry with a clean towel.  10.  Wear clean pajamas.            11.  Place clean sheets on your bed the night of your first shower and do not  sleep with pets. Day of Surgery : Do not apply any lotions/deodorants the morning of surgery.  Please wear clean clothes to the hospital/surgery center.  FAILURE TO FOLLOW THESE INSTRUCTIONS MAY RESULT IN THE CANCELLATION OF YOUR SURGERY PATIENT SIGNATURE_________________________________  NURSE  SIGNATURE__________________________________  ________________________________________________________________________

## 2022-03-15 DIAGNOSIS — R972 Elevated prostate specific antigen [PSA]: Secondary | ICD-10-CM | POA: Diagnosis not present

## 2022-03-16 ENCOUNTER — Encounter (HOSPITAL_COMMUNITY)
Admission: RE | Admit: 2022-03-16 | Discharge: 2022-03-16 | Disposition: A | Discharge status: Home / Self Care | Payer: Medicare Other | Source: Ambulatory Visit | Attending: Urology | Admitting: Urology

## 2022-03-16 ENCOUNTER — Other Ambulatory Visit: Payer: Self-pay

## 2022-03-16 ENCOUNTER — Encounter (HOSPITAL_COMMUNITY): Payer: Self-pay

## 2022-03-16 VITALS — BP 137/93 | HR 62 | Temp 98.5°F | Resp 16 | Ht 68.0 in | Wt 173.0 lb

## 2022-03-16 DIAGNOSIS — Z01818 Encounter for other preprocedural examination: Secondary | ICD-10-CM | POA: Diagnosis not present

## 2022-03-16 HISTORY — DX: Essential (primary) hypertension: I10

## 2022-03-16 HISTORY — DX: Gastro-esophageal reflux disease without esophagitis: K21.9

## 2022-03-16 LAB — BASIC METABOLIC PANEL
Anion gap: 7 (ref 5–15)
BUN: 27 mg/dL — ABNORMAL HIGH (ref 8–23)
CO2: 27 mmol/L (ref 22–32)
Calcium: 9.2 mg/dL (ref 8.9–10.3)
Chloride: 105 mmol/L (ref 98–111)
Creatinine, Ser: 1.04 mg/dL (ref 0.61–1.24)
GFR, Estimated: >60 mL/min (ref >60)
Glucose, Bld: 104 mg/dL — ABNORMAL HIGH (ref 70–99)
Potassium: 4.3 mmol/L (ref 3.5–5.1)
Sodium: 139 mmol/L (ref 135–145)

## 2022-03-16 LAB — CBC
HCT: 44.2 % (ref 39.0–52.0)
Hemoglobin: 14.3 g/dL (ref 13.0–17.0)
MCH: 31.6 pg (ref 26.0–34.0)
MCHC: 32.4 g/dL (ref 30.0–36.0)
MCV: 97.6 fL (ref 80.0–100.0)
Platelets: 170 10*3/uL (ref 150–400)
RBC: 4.53 MIL/uL (ref 4.22–5.81)
RDW: 13.2 % (ref 11.5–15.5)
WBC: 6.7 10*3/uL (ref 4.0–10.5)
nRBC: 0 % (ref 0.0–0.2)

## 2022-03-24 LAB — TYPE AND SCREEN
ABO/RH(D): A POS
Antibody Screen: NEGATIVE

## 2022-03-26 NOTE — Anesthesia Preprocedure Evaluation (Signed)
Anesthesia Evaluation  Patient identified by MRN, date of birth, ID band Patient awake    Reviewed: Allergy & Precautions, NPO status , Patient's Chart, lab work & pertinent test results  History of Anesthesia Complications Negative for: history of anesthetic complications  Airway Mallampati: II  TM Distance: >3 FB Neck ROM: Full    Dental  (+) Poor Dentition, Dental Advisory Given   Pulmonary neg pulmonary ROS   Pulmonary exam normal breath sounds clear to auscultation       Cardiovascular hypertension (carvedilol, losartan), Pt. on home beta blockers and Pt. on medications (-) angina (-) Past MI, (-) Cardiac Stents and (-) CABG (-) dysrhythmias  Rhythm:Regular Rate:Normal  HLD   Neuro/Psych negative neurological ROS     GI/Hepatic Neg liver ROS,GERD  ,,  Endo/Other  negative endocrine ROS    Renal/GU Left renal mass     Musculoskeletal   Abdominal   Peds  Hematology negative hematology ROS (+)   Anesthesia Other Findings   Reproductive/Obstetrics                             Anesthesia Physical Anesthesia Plan  ASA: 3  Anesthesia Plan: General   Post-op Pain Management: Tylenol PO (pre-op)*   Induction: Intravenous  PONV Risk Score and Plan: 2 and Ondansetron and Dexamethasone  Airway Management Planned: Oral ETT  Additional Equipment:   Intra-op Plan:   Post-operative Plan: Extubation in OR  Informed Consent: I have reviewed the patients History and Physical, chart, labs and discussed the procedure including the risks, benefits and alternatives for the proposed anesthesia with the patient or authorized representative who has indicated his/her understanding and acceptance.     Dental advisory given  Plan Discussed with: CRNA and Anesthesiologist  Anesthesia Plan Comments: (Risks of general anesthesia discussed including, but not limited to, sore throat, hoarse voice,  chipped/damaged teeth, injury to vocal cords, nausea and vomiting, allergic reactions, lung infection, heart attack, stroke, and death. All questions answered. )       Anesthesia Quick Evaluation

## 2022-03-27 ENCOUNTER — Inpatient Hospital Stay (HOSPITAL_COMMUNITY): Payer: Medicare Other | Admitting: Physician Assistant

## 2022-03-27 ENCOUNTER — Encounter (HOSPITAL_COMMUNITY): Payer: Self-pay | Admitting: Urology

## 2022-03-27 ENCOUNTER — Encounter (HOSPITAL_COMMUNITY)
Admission: RE | Disposition: A | Discharge status: Home / Self Care | Payer: Self-pay | Source: Ambulatory Visit | Attending: Urology

## 2022-03-27 ENCOUNTER — Other Ambulatory Visit: Payer: Self-pay

## 2022-03-27 ENCOUNTER — Observation Stay (HOSPITAL_COMMUNITY)
Admission: RE | Admit: 2022-03-27 | Discharge: 2022-03-28 | Disposition: A | Discharge status: Home / Self Care | Payer: Medicare Other | Source: Ambulatory Visit | Attending: Urology | Admitting: Urology

## 2022-03-27 ENCOUNTER — Inpatient Hospital Stay (HOSPITAL_COMMUNITY): Payer: Medicare Other | Admitting: Anesthesiology

## 2022-03-27 DIAGNOSIS — N2889 Other specified disorders of kidney and ureter: Principal | ICD-10-CM | POA: Diagnosis present

## 2022-03-27 DIAGNOSIS — Z85828 Personal history of other malignant neoplasm of skin: Secondary | ICD-10-CM | POA: Diagnosis not present

## 2022-03-27 DIAGNOSIS — I10 Essential (primary) hypertension: Secondary | ICD-10-CM | POA: Diagnosis not present

## 2022-03-27 DIAGNOSIS — Z79899 Other long term (current) drug therapy: Secondary | ICD-10-CM | POA: Diagnosis not present

## 2022-03-27 DIAGNOSIS — Z01818 Encounter for other preprocedural examination: Secondary | ICD-10-CM

## 2022-03-27 DIAGNOSIS — C642 Malignant neoplasm of left kidney, except renal pelvis: Secondary | ICD-10-CM | POA: Diagnosis not present

## 2022-03-27 DIAGNOSIS — E785 Hyperlipidemia, unspecified: Secondary | ICD-10-CM | POA: Diagnosis not present

## 2022-03-27 HISTORY — PX: ROBOT ASSISTED LAPAROSCOPIC NEPHRECTOMY: SHX5140

## 2022-03-27 LAB — HEMOGLOBIN AND HEMATOCRIT, BLOOD
HCT: 42.3 % (ref 39.0–52.0)
Hemoglobin: 13.6 g/dL (ref 13.0–17.0)

## 2022-03-27 LAB — ABO/RH: ABO/RH(D): A POS

## 2022-03-27 SURGERY — NEPHRECTOMY, RADICAL, ROBOT-ASSISTED, LAPAROSCOPIC, ADULT
Anesthesia: General | Laterality: Left

## 2022-03-27 MED ORDER — SUGAMMADEX SODIUM 500 MG/5ML IV SOLN
INTRAVENOUS | Status: DC | PRN
  Administered 2022-03-27: 400 mg via INTRAVENOUS

## 2022-03-27 MED ORDER — EPHEDRINE 5 MG/ML INJ
INTRAVENOUS | Status: AC
  Filled 2022-03-27: qty 5

## 2022-03-27 MED ORDER — LIDOCAINE HCL (CARDIAC) PF 100 MG/5ML IV SOSY
PREFILLED_SYRINGE | INTRAVENOUS | Status: DC | PRN
  Administered 2022-03-27: 100 mg via INTRAVENOUS

## 2022-03-27 MED ORDER — ONDANSETRON HCL 4 MG/2ML IJ SOLN
INTRAMUSCULAR | Status: DC | PRN
  Administered 2022-03-27: 4 mg via INTRAVENOUS

## 2022-03-27 MED ORDER — CEFAZOLIN SODIUM-DEXTROSE 2-4 GM/100ML-% IV SOLN
2.0000 g | INTRAVENOUS | Status: AC
  Administered 2022-03-27: 2 g via INTRAVENOUS
  Filled 2022-03-27: qty 100

## 2022-03-27 MED ORDER — DEXAMETHASONE SODIUM PHOSPHATE 10 MG/ML IJ SOLN
INTRAMUSCULAR | Status: AC
  Filled 2022-03-27: qty 1

## 2022-03-27 MED ORDER — FENTANYL CITRATE PF 50 MCG/ML IJ SOSY
PREFILLED_SYRINGE | INTRAMUSCULAR | Status: AC
  Filled 2022-03-27: qty 2

## 2022-03-27 MED ORDER — FENTANYL CITRATE (PF) 100 MCG/2ML IJ SOLN
INTRAMUSCULAR | Status: DC | PRN
  Administered 2022-03-27 (×5): 50 ug via INTRAVENOUS

## 2022-03-27 MED ORDER — ACETAMINOPHEN 500 MG PO TABS
1000.0000 mg | ORAL_TABLET | Freq: Once | ORAL | Status: AC
  Administered 2022-03-27: 1000 mg via ORAL
  Filled 2022-03-27: qty 2

## 2022-03-27 MED ORDER — HYDROCODONE-ACETAMINOPHEN 5-325 MG PO TABS: 1.0000 | ORAL_TABLET | Freq: Four times a day (QID) | ORAL | 0 refills | Status: AC | PRN

## 2022-03-27 MED ORDER — DIPHENHYDRAMINE HCL 50 MG/ML IJ SOLN: 12.5000 mg | Freq: Four times a day (QID) | INTRAMUSCULAR | Status: DC | PRN

## 2022-03-27 MED ORDER — ROCURONIUM BROMIDE 10 MG/ML (PF) SYRINGE
PREFILLED_SYRINGE | INTRAVENOUS | Status: AC
  Filled 2022-03-27: qty 20

## 2022-03-27 MED ORDER — MIDAZOLAM HCL 5 MG/5ML IJ SOLN
INTRAMUSCULAR | Status: DC | PRN
  Administered 2022-03-27: 1 mg via INTRAVENOUS

## 2022-03-27 MED ORDER — ROSUVASTATIN CALCIUM 20 MG PO TABS
40.0000 mg | ORAL_TABLET | Freq: Every day | ORAL | Status: DC
  Administered 2022-03-27: 40 mg via ORAL
  Filled 2022-03-27: qty 2

## 2022-03-27 MED ORDER — STERILE WATER FOR IRRIGATION IR SOLN
Status: DC | PRN
  Administered 2022-03-27: 1000 mL

## 2022-03-27 MED ORDER — DEXAMETHASONE SODIUM PHOSPHATE 10 MG/ML IJ SOLN
INTRAMUSCULAR | Status: DC | PRN
  Administered 2022-03-27: 10 mg via INTRAVENOUS

## 2022-03-27 MED ORDER — SODIUM CHLORIDE (PF) 0.9 % IJ SOLN
INTRAMUSCULAR | Status: DC | PRN
  Administered 2022-03-27: 20 mL

## 2022-03-27 MED ORDER — OXYCODONE HCL 5 MG PO TABS
5.0000 mg | ORAL_TABLET | ORAL | Status: DC | PRN
  Administered 2022-03-28: 5 mg via ORAL
  Filled 2022-03-27: qty 1

## 2022-03-27 MED ORDER — ONDANSETRON HCL 4 MG/2ML IJ SOLN
INTRAMUSCULAR | Status: AC
  Filled 2022-03-27: qty 2

## 2022-03-27 MED ORDER — SODIUM CHLORIDE (PF) 0.9 % IJ SOLN
INTRAMUSCULAR | Status: AC
  Filled 2022-03-27: qty 20

## 2022-03-27 MED ORDER — FINASTERIDE 5 MG PO TABS
5.0000 mg | ORAL_TABLET | Freq: Every day | ORAL | Status: DC
  Administered 2022-03-27: 5 mg via ORAL
  Filled 2022-03-27: qty 1

## 2022-03-27 MED ORDER — DOCUSATE SODIUM 100 MG PO CAPS: 100.0000 mg | ORAL_CAPSULE | Freq: Two times a day (BID) | ORAL | Status: AC

## 2022-03-27 MED ORDER — MIDAZOLAM HCL 2 MG/2ML IJ SOLN
INTRAMUSCULAR | Status: AC
  Filled 2022-03-27: qty 2

## 2022-03-27 MED ORDER — LACTATED RINGERS IR SOLN
Status: DC | PRN
  Administered 2022-03-27: 1000 mL

## 2022-03-27 MED ORDER — "VISTASEAL 4 ML SINGLE DOSE KIT "
4.0000 mL | PACK | Freq: Once | CUTANEOUS | Status: AC
  Administered 2022-03-27: 4 mL via TOPICAL
  Filled 2022-03-27: qty 4

## 2022-03-27 MED ORDER — EPHEDRINE SULFATE (PRESSORS) 50 MG/ML IJ SOLN
INTRAMUSCULAR | Status: DC | PRN
  Administered 2022-03-27: 5 mg via INTRAVENOUS
  Administered 2022-03-27 (×2): 10 mg via INTRAVENOUS

## 2022-03-27 MED ORDER — TRIPLE ANTIBIOTIC 3.5-400-5000 EX OINT: 1.0000 | TOPICAL_OINTMENT | Freq: Three times a day (TID) | CUTANEOUS | Status: DC | PRN

## 2022-03-27 MED ORDER — FENTANYL CITRATE PF 50 MCG/ML IJ SOSY
PREFILLED_SYRINGE | INTRAMUSCULAR | Status: AC
  Filled 2022-03-27: qty 1

## 2022-03-27 MED ORDER — TAMSULOSIN HCL 0.4 MG PO CAPS
0.4000 mg | ORAL_CAPSULE | Freq: Every day | ORAL | Status: DC
  Administered 2022-03-27: 0.4 mg via ORAL
  Filled 2022-03-27: qty 1

## 2022-03-27 MED ORDER — ROCURONIUM BROMIDE 100 MG/10ML IV SOLN
INTRAVENOUS | Status: DC | PRN
  Administered 2022-03-27: 30 mg via INTRAVENOUS
  Administered 2022-03-27: 20 mg via INTRAVENOUS
  Administered 2022-03-27: 30 mg via INTRAVENOUS
  Administered 2022-03-27: 50 mg via INTRAVENOUS

## 2022-03-27 MED ORDER — OXYCODONE HCL 5 MG/5ML PO SOLN: 5.0000 mg | Freq: Once | ORAL | Status: DC | PRN

## 2022-03-27 MED ORDER — OXYCODONE HCL 5 MG PO TABS: 5.0000 mg | ORAL_TABLET | Freq: Once | ORAL | Status: DC | PRN

## 2022-03-27 MED ORDER — ONDANSETRON HCL 4 MG/2ML IJ SOLN: 4.0000 mg | INTRAMUSCULAR | Status: DC | PRN

## 2022-03-27 MED ORDER — PHENYLEPHRINE 80 MCG/ML (10ML) SYRINGE FOR IV PUSH (FOR BLOOD PRESSURE SUPPORT)
PREFILLED_SYRINGE | INTRAVENOUS | Status: AC
  Filled 2022-03-27: qty 10

## 2022-03-27 MED ORDER — FENTANYL CITRATE (PF) 250 MCG/5ML IJ SOLN
INTRAMUSCULAR | Status: AC
  Filled 2022-03-27: qty 5

## 2022-03-27 MED ORDER — PHENYLEPHRINE HCL (PRESSORS) 10 MG/ML IV SOLN
INTRAVENOUS | Status: AC
  Filled 2022-03-27: qty 1

## 2022-03-27 MED ORDER — FENTANYL CITRATE PF 50 MCG/ML IJ SOSY
25.0000 ug | PREFILLED_SYRINGE | INTRAMUSCULAR | Status: DC | PRN
  Administered 2022-03-27 (×3): 50 ug via INTRAVENOUS

## 2022-03-27 MED ORDER — PHENYLEPHRINE HCL-NACL 20-0.9 MG/250ML-% IV SOLN
INTRAVENOUS | Status: DC | PRN
  Administered 2022-03-27: 30 ug/min via INTRAVENOUS

## 2022-03-27 MED ORDER — DOCUSATE SODIUM 100 MG PO CAPS
100.0000 mg | ORAL_CAPSULE | Freq: Two times a day (BID) | ORAL | Status: DC
  Administered 2022-03-27 – 2022-03-28 (×2): 100 mg via ORAL
  Filled 2022-03-27 (×2): qty 1

## 2022-03-27 MED ORDER — HYDROMORPHONE HCL 1 MG/ML IJ SOLN: 0.5000 mg | INTRAMUSCULAR | Status: DC | PRN

## 2022-03-27 MED ORDER — HYOSCYAMINE SULFATE 0.125 MG SL SUBL: 0.1250 mg | SUBLINGUAL_TABLET | SUBLINGUAL | Status: DC | PRN

## 2022-03-27 MED ORDER — CARVEDILOL 12.5 MG PO TABS
12.5000 mg | ORAL_TABLET | Freq: Two times a day (BID) | ORAL | Status: DC
  Administered 2022-03-27 – 2022-03-28 (×2): 12.5 mg via ORAL
  Filled 2022-03-27 (×2): qty 1

## 2022-03-27 MED ORDER — PROPOFOL 10 MG/ML IV BOLUS
INTRAVENOUS | Status: AC
  Filled 2022-03-27: qty 20

## 2022-03-27 MED ORDER — HEMOSTATIC AGENTS (NO CHARGE) OPTIME
TOPICAL | Status: DC | PRN
  Administered 2022-03-27: 1 via TOPICAL

## 2022-03-27 MED ORDER — LACTATED RINGERS IV SOLN: INTRAVENOUS | Status: DC

## 2022-03-27 MED ORDER — BUPIVACAINE LIPOSOME 1.3 % IJ SUSP
INTRAMUSCULAR | Status: AC
  Filled 2022-03-27: qty 20

## 2022-03-27 MED ORDER — PROPOFOL 10 MG/ML IV BOLUS
INTRAVENOUS | Status: DC | PRN
  Administered 2022-03-27: 150 mg via INTRAVENOUS

## 2022-03-27 MED ORDER — AMISULPRIDE (ANTIEMETIC) 5 MG/2ML IV SOLN: 10.0000 mg | Freq: Once | INTRAVENOUS | Status: DC | PRN

## 2022-03-27 MED ORDER — ACETAMINOPHEN 500 MG PO TABS
1000.0000 mg | ORAL_TABLET | Freq: Four times a day (QID) | ORAL | Status: AC
  Administered 2022-03-27 – 2022-03-28 (×4): 1000 mg via ORAL
  Filled 2022-03-27 (×4): qty 2

## 2022-03-27 MED ORDER — CHLORHEXIDINE GLUCONATE 0.12 % MT SOLN
15.0000 mL | Freq: Once | OROMUCOSAL | Status: AC
  Administered 2022-03-27: 15 mL via OROMUCOSAL

## 2022-03-27 MED ORDER — SODIUM CHLORIDE 0.45 % IV SOLN: INTRAVENOUS | Status: DC

## 2022-03-27 MED ORDER — ORAL CARE MOUTH RINSE: 15.0000 mL | Freq: Once | OROMUCOSAL | Status: AC

## 2022-03-27 MED ORDER — BUPIVACAINE LIPOSOME 1.3 % IJ SUSP
INTRAMUSCULAR | Status: DC | PRN
  Administered 2022-03-27: 20 mL

## 2022-03-27 MED ORDER — LIDOCAINE HCL (PF) 2 % IJ SOLN
INTRAMUSCULAR | Status: AC
  Filled 2022-03-27: qty 5

## 2022-03-27 MED ORDER — DIPHENHYDRAMINE HCL 12.5 MG/5ML PO ELIX: 12.5000 mg | ORAL_SOLUTION | Freq: Four times a day (QID) | ORAL | Status: DC | PRN

## 2022-03-27 SURGICAL SUPPLY — 82 items
ADH SKN CLS APL DERMABOND .7 (GAUZE/BANDAGES/DRESSINGS) ×1
AGENT HMST KT MTR STRL THRMB (HEMOSTASIS) ×1
APL ESCP 34 STRL LF DISP (HEMOSTASIS) ×1
APL LAPSCP 35 DL APL RGD (MISCELLANEOUS) ×1
APL PRP STRL LF DISP 70% ISPRP (MISCELLANEOUS) ×1
APPLICATOR SURGIFLO ENDO (HEMOSTASIS) IMPLANT
APPLICATOR VISTASEAL 35 (MISCELLANEOUS) IMPLANT
BAG COUNTER SPONGE SURGICOUNT (BAG) ×1 IMPLANT
BAG LAPAROSCOPIC 12 15 PORT 16 (BASKET) ×1 IMPLANT
BAG RETRIEVAL 12/15 (BASKET)
BAG SPNG CNTER NS LX DISP (BAG) ×1
CHLORAPREP W/TINT 26 (MISCELLANEOUS) ×1 IMPLANT
CLIP LIGATING HEM O LOK PURPLE (MISCELLANEOUS) ×1 IMPLANT
CLIP LIGATING HEMO LOK XL GOLD (MISCELLANEOUS) ×1 IMPLANT
CLIP LIGATING HEMO O LOK GREEN (MISCELLANEOUS) ×1 IMPLANT
CLIP SUT LAPRA TY ABSORB (SUTURE) IMPLANT
COVER SURGICAL LIGHT HANDLE (MISCELLANEOUS) ×1 IMPLANT
COVER TIP SHEARS 8 DVNC (MISCELLANEOUS) ×1 IMPLANT
COVER TIP SHEARS 8MM DA VINCI (MISCELLANEOUS) ×1
CUTTER ECHEON FLEX ENDO 45 340 (ENDOMECHANICALS) IMPLANT
DERMABOND ADVANCED .7 DNX12 (GAUZE/BANDAGES/DRESSINGS) ×2 IMPLANT
DRAIN CHANNEL 15F RND FF 3/16 (WOUND CARE) IMPLANT
DRAPE ARM DVNC X/XI (DISPOSABLE) ×4 IMPLANT
DRAPE COLUMN DVNC XI (DISPOSABLE) ×1 IMPLANT
DRAPE DA VINCI XI ARM (DISPOSABLE) ×4
DRAPE DA VINCI XI COLUMN (DISPOSABLE) ×1
DRAPE INCISE IOBAN 66X45 STRL (DRAPES) ×1 IMPLANT
DRAPE SHEET LG 3/4 BI-LAMINATE (DRAPES) ×1 IMPLANT
DRSG TEGADERM 4X4.75 (GAUZE/BANDAGES/DRESSINGS) IMPLANT
ELECT PENCIL ROCKER SW 15FT (MISCELLANEOUS) ×1 IMPLANT
ELECT REM PT RETURN 15FT ADLT (MISCELLANEOUS) ×1 IMPLANT
EVACUATOR SILICONE 100CC (DRAIN) IMPLANT
GAUZE SPONGE 2X2 8PLY STRL LF (GAUZE/BANDAGES/DRESSINGS) IMPLANT
GLOVE BIO SURGEON STRL SZ 6.5 (GLOVE) ×1 IMPLANT
GLOVE BIOGEL M 7.0 STRL (GLOVE) ×2 IMPLANT
GLOVE BIOGEL PI IND STRL 7.0 (GLOVE) ×1 IMPLANT
GOWN SRG XL LVL 4 BRTHBL STRL (GOWNS) ×1 IMPLANT
GOWN STRL NON-REIN XL LVL4 (GOWNS) ×1
GOWN STRL REUS W/ TWL XL LVL3 (GOWN DISPOSABLE) ×2 IMPLANT
GOWN STRL REUS W/TWL XL LVL3 (GOWN DISPOSABLE) ×2
HOLDER FOLEY CATH W/STRAP (MISCELLANEOUS) ×1 IMPLANT
IRRIG SUCT STRYKERFLOW 2 WTIP (MISCELLANEOUS) ×1
IRRIGATION SUCT STRKRFLW 2 WTP (MISCELLANEOUS) ×1 IMPLANT
KIT BASIN OR (CUSTOM PROCEDURE TRAY) ×1 IMPLANT
KIT TURNOVER KIT A (KITS) IMPLANT
LOOP VESSEL MAXI BLUE (MISCELLANEOUS) IMPLANT
MARKER SKIN DUAL TIP RULER LAB (MISCELLANEOUS) ×1 IMPLANT
NDL INSUFFLATION 14GA 120MM (NEEDLE) IMPLANT
NEEDLE INSUFFLATION 14GA 120MM (NEEDLE) ×1 IMPLANT
PATTIES SURGICAL .5 X3 (DISPOSABLE) IMPLANT
PROTECTOR NERVE ULNAR (MISCELLANEOUS) ×2 IMPLANT
RELOAD STAPLE 45 2.6 WHT THIN (STAPLE) IMPLANT
SEAL UNIV 5-12 XI (MISCELLANEOUS) ×3 IMPLANT
SEAL XI UNIVERSAL 5-12 (MISCELLANEOUS) ×4
SEALER VESSEL DA VINCI XI (MISCELLANEOUS)
SEALER VESSEL EXT DVNC XI (MISCELLANEOUS) IMPLANT
SET TUBE SMOKE EVAC HIGH FLOW (TUBING) ×1 IMPLANT
SOL ELECTROSURG ANTI STICK (MISCELLANEOUS) ×1
SOLUTION ELECTROSURG ANTI STCK (MISCELLANEOUS) ×1 IMPLANT
SPIKE FLUID TRANSFER (MISCELLANEOUS) ×1 IMPLANT
SPONGE T-LAP 4X18 ~~LOC~~+RFID (SPONGE) IMPLANT
STAPLE RELOAD 45 WHT (STAPLE) IMPLANT
STAPLE RELOAD 45MM WHITE (STAPLE)
SURGIFLO W/THROMBIN 8M KIT (HEMOSTASIS) IMPLANT
SUT ETHILON 3 0 PS 1 (SUTURE) IMPLANT
SUT MNCRL AB 4-0 PS2 18 (SUTURE) ×2 IMPLANT
SUT PDS AB 1 CT1 27 (SUTURE) ×1 IMPLANT
SUT VIC AB 0 CT1 27 (SUTURE)
SUT VIC AB 0 CT1 27XBRD ANTBC (SUTURE) IMPLANT
SUT VIC AB 2-0 SH 27 (SUTURE) ×6
SUT VIC AB 2-0 SH 27X BRD (SUTURE) ×1 IMPLANT
SUT VIC AB 2-0 SH 27XBRD (SUTURE) IMPLANT
SUT VICRYL 0 UR6 27IN ABS (SUTURE) ×1 IMPLANT
SUT VLOC 3-0 9IN GRN (SUTURE) IMPLANT
SYS BAG RETRIEVAL 10MM (BASKET) ×1
SYSTEM BAG RETRIEVAL 10MM (BASKET) IMPLANT
TOWEL OR 17X26 10 PK STRL BLUE (TOWEL DISPOSABLE) ×1 IMPLANT
TRAY FOLEY MTR SLVR 16FR STAT (SET/KITS/TRAYS/PACK) ×1 IMPLANT
TRAY LAPAROSCOPIC (CUSTOM PROCEDURE TRAY) ×1 IMPLANT
TROCAR Z THREAD OPTICAL 12X100 (TROCAR) ×1 IMPLANT
TROCAR Z-THREAD OPTICAL 5X100M (TROCAR) IMPLANT
WATER STERILE IRR 1000ML POUR (IV SOLUTION) ×1 IMPLANT

## 2022-03-27 NOTE — Discharge Summary (Signed)
[facility] Discharge Summary  Admit date: 03/27/2022  Discharge date and time: 03/28/22   Discharge to: Home  Discharge Service: Urology  Discharge Attending Physician:  Dr. Rexene Alberts, MD  Discharge  Diagnoses: Renal mass  Secondary Diagnosis: Principal Problem:   Renal mass   OR Procedures: Procedure(s): XI LEFT ROBOTIC ASSISTED LAPAROSCOPIC PARTIAL NEPHRECTOMY 03/27/2022   Ancillary Procedures: None   Discharge Day Services: The patient was seen and examined by the Urology team both in the morning and immediately prior to discharge.  Vital signs and laboratory values were stable and within normal limits.  The physical exam was benign and unchanged and all surgical wounds were examined.  Discharge instructions were explained and all questions answered.  Subjective  No acute events overnight. Pain Controlled. No fever or chills.  Objective No data found.  Total I/O In: 240 [P.O.:240] Out: 1680 [Urine:1650; Drains:30]  General Appearance:        No acute distress Lungs:                       Normal work of breathing on room air Heart:                                Regular rate and rhythm Abdomen:                         Soft, non-tender, non-distended, incisions c/d/i GU:         Voiding spontaneously Extremities:                      Warm and well perfused   Hospital Course:  The patient underwent robotic left partial nephrectomy on 03/27/2022.  The patient tolerated the procedure well, was extubated in the OR, and afterwards was taken to the PACU for routine post-surgical care. When stable the patient was transferred to the floor.   The patient did well postoperatively.  The patient's diet was slowly advanced and at the time of discharge was tolerating a regular diet.  The patient was discharged home 1 Day Post-Op, at which point was tolerating a regular solid diet, was able to void spontaneously, have adequate pain control with P.O. pain medication, and could  ambulate without difficulty. The patient will follow up with [place] for post op check.   Condition at Discharge: Improved  Discharge Medications:  Allergies as of 03/28/2022       Reactions   Lactose Intolerance (gi) Other (See Comments)   Aleve [naproxen] Swelling   Facial swelling   Aspirin Swelling   Facial swelling   Niacin And Related Other (See Comments)   Severe flushing   Pravastatin Other (See Comments)   Muscle aches   Salicylates Swelling   Facial swelling   Ciprofloxacin Itching, Rash        Medication List     STOP taking these medications    multivitamin tablet       TAKE these medications    carvedilol 12.5 MG tablet Commonly known as: COREG Take 12.5 mg by mouth 2 (two) times daily with a meal.   [employer] Dairy Relief 9000 units Chew Generic drug: Lactase Chew 9,000 Units by mouth 2 (two) times daily with a meal.   docusate sodium 100 MG capsule Commonly known as: COLACE Take 1 capsule (100 mg total) by mouth 2 (two) times daily.  finasteride 5 MG tablet Commonly known as: PROSCAR Take 5 mg by mouth daily.   HYDROcodone-acetaminophen 5-325 MG tablet Commonly known as: Norco Take 1-2 tablets by mouth every 6 (six) hours as needed for moderate pain or severe pain.   losartan 50 MG tablet Commonly known as: COZAAR Take 50 mg by mouth daily.   rosuvastatin 40 MG tablet Commonly known as: CRESTOR Take 40 mg by mouth daily.   tamsulosin 0.4 MG Caps capsule Commonly known as: FLOMAX Take 0.4 mg by mouth daily.

## 2022-03-27 NOTE — Anesthesia Procedure Notes (Signed)
Procedure Name: Intubation Date/Time: 03/27/2022 7:43 AM  Performed by: Jonna Munro, CRNAPre-anesthesia Checklist: Patient identified, Emergency Drugs available, Suction available, Patient being monitored and Timeout performed Patient Re-evaluated:Patient Re-evaluated prior to induction Oxygen Delivery Method: Circle system utilized Preoxygenation: Pre-oxygenation with 100% oxygen Induction Type: IV induction Ventilation: Mask ventilation without difficulty Laryngoscope Size: Mac and 3 Grade View: Grade II Tube type: Oral Tube size: 7.5 mm Number of attempts: 1 Airway Equipment and Method: Stylet Placement Confirmation: ETT inserted through vocal cords under direct vision, positive ETCO2, CO2 detector and breath sounds checked- equal and bilateral Secured at: 22 cm Tube secured with: Tape Dental Injury: Teeth and Oropharynx as per pre-operative assessment

## 2022-03-27 NOTE — Discharge Instructions (Signed)

## 2022-03-27 NOTE — Transfer of Care (Signed)
Immediate Anesthesia Transfer of Care Note  Patient: Evan Calderon  Procedure(s) Performed: XI LEFT ROBOTIC ASSISTED LAPAROSCOPIC PARTIAL NEPHRECTOMY (Left)  Patient Location: PACU  Anesthesia Type:General  Level of Consciousness: awake, alert , oriented, and patient cooperative  Airway & Oxygen Therapy: Patient Spontanous Breathing and Patient connected to face mask oxygen  Post-op Assessment: Report given to RN, Post -op Vital signs reviewed and stable, and Patient moving all extremities X 4  Post vital signs: Reviewed and stable  Last Vitals:  Vitals Value Taken Time  BP 123/72 03/27/22 1124  Temp    Pulse 58 03/27/22 1128  Resp 12 03/27/22 1128  SpO2 100 % 03/27/22 1128  Vitals shown include unvalidated device data.  Last Pain:  Vitals:   03/27/22 1124  TempSrc:   PainSc: 0-No pain      Patients Stated Pain Goal: 0 ([id number])  Complications: No notable events documented.

## 2022-03-27 NOTE — Care Management Obs Status (Signed)
[place] NOTIFICATION   Patient Details  Name: ARTHUR MICKLEY MRN: [id number] Date of Birth: Apr 06, 1955   Medicare Observation Status Notification Given:  Yes    Phyllis Ginger, RN 03/27/2022, 2:29 PM

## 2022-03-27 NOTE — H&P (Signed)
Office Visit Report     03/14/2022   --------------------------------------------------------------------------------   Evan Calderon  [id number]  DOB: 01/11/1955, 67 year old Male  SSN: [id number]   PRIMARY CARE:  C Melinda Crutch, MD  PRIMARY CARE FAX:  [phone]  REFERRING:  Irine Seal, MD  PROVIDER:  Irine Seal, M.D.  TREATING:  [place], [place]  LOCATION:  [facility] [phone]     --------------------------------------------------------------------------------   CC/HPI: Pt presents today for pre-operative history and physical exam in anticipation of robotic assisted lap left partial nephrectomy by Dr. Abner Greenspan on 03/27/22. He is doing well and is without complaint.   Of note, his brother passed away in February 06, 2021 but he is at peace with this.   Pt denies F/C, HA, CP, SOB, N/V, diarrhea/constipation, back pain, flank pain, hematuria, and dysuria.     HX:    Evan Calderon is a 67 year old male seen in followup today for his history of BPH with BOO and an elevated PSA.   1. Left renal mass:  -He developed some right upper quadrant abdominal pain in 08/2020 and initially had abdominal ultrasound that time demonstrated no abnormality. His right-sided flank pain persisted and he underwent HIDA scan that demonstrated antral gastric bile reflux. His pain persisted and he underwent CT A/P on 05/16/2021 that incidentally revealed a complex mass in the lower pole of the left kidney measuring 1.7 x 1.7 x 2.1 cm is read as a least Bosniak category 3. There is no lymphadenopathy and no evidence of renal involvement.  -I met him in 05/2021 and recommended left robotic assisted laparoscopic partial nephrectomy. He wanted to postpone his surgery as he is the primary care giver for his brother who lives in [place]. This was scheduled for 10/2021 however he canceled this as his brother developed complications and was hospitalized.  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 No evidence of renal vein tumor thrombus. No evidence of metastatic disease.  He denies any left-sided flank pain. He denies gross hematuria. He denies family history of urologic malignancy.  -He has considered options of partial nephrectomy and ablation and would like to schedule partial nephrectomy.   #2. Elevated PSA: He has a history of 2 negative TRUS biopsies, last one in 10/2004. TRUS volume was 33 cc. PSA was 7.5 prior to finasteride which was started in 09/2019. PSA was 1.76 in 12/2019. MRI in 11/2017 was unremarkable and prostate measured 72 cc. PSA in 12/2020 was 1.7 and when corrected for use of finasteride is around 3.5. His PSA on 12/07/21 was 2.41.   3. BPH/LUTS: He continues on tamsulosin 0.4 mg and finasteride 5 mg daily with benefit.   Patient currently denies fever, chills, sweats, nausea, vomiting, abdominal or flank pain, gross hematuria or dysuria.   He has a past medical history of hypertension, hyperlipidemia. He denies cardiac or lung history. He denies taking anticoagulation.   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 He travels every 2 weeks to help care for his brother in [place].   12/14/21: Evan Calderon returns today in f/u. He remains on tamsulosin and finasteride and his IPSS is 8. He has nocturia x 2. His most recent PSA is up to 2.41. He has had to delay his renal surgery to care for his brother and that is being rescheduled.  ALLERGIES: Aspirin TABS - lip swelling Cipro TABS - Skin Rash NSAIDs - lip swelling     Notes: Niacin--flushing    MEDICATIONS: Finasteride 5 mg tablet 1 tablet PO Daily  Tamsulosin Hcl 0.4 mg capsule 1 capsule PO Daily  Carvedilol 12.5 mg tablet  Imiquimod 5 % cream in packet  Imodium A-D 2 mg capsule  Lactase  Losartan Potassium 25 mg tablet  Multivitamin  Rosuvastatin Calcium 40 mg tablet  Simethicone 80 mg  tablet,chewable     GU PSH: Complex Uroflow - 2019 Locm 300-399Mg /Ml Iodine,1Ml - 09/21/2021 Prostate Needle Biopsy - 2008       PSH Notes: Biopsy Of The Prostate Needle, Colonoscopy (Fiberoptic)  ganglion cyst removal left wrist    NON-GU PSH: Diagnostic Colonoscopy - 2008     GU PMH: BPH w/LUTS, He has stable LUTS on tamsulosin and finasteride which I have refilled. - 12/14/2021, - 11/17/2021, He is doing better on the finasteride and tamsulosin. We had refilled those in the last few weeks. , - 12/13/2020, He is voiding well on finasteride and tamsulosin but has some persistent but variable nocturia. Meds refilled. , - 12/15/2019, Benign prostatic hyperplasia with urinary obstruction, - 2016 Elevated PSA, His PSA is up a little bit on the finasteride. I will repeat in 3 months and if there is further increase, he may need additional evaluation. If it is back down, he will return in a year with a PSA. - 12/14/2021, - 11/17/2021, - 06/01/2021, His PSA is well suppressed with finasteride. He will return in a year. , - 12/13/2020, He is doing well and the PSA is back down slightly. He will return in 1 year with a PSA. , - 12/15/2019, His PSA fell with the finasteride but is back up some. I am going to have him return in 3 months with a repeat PSA and if that is up more, he will need another biopsy. , - 2021, His PSA has declined as expected with finasteride. I will repeat it in 6 months. , - 2021 (Worsening), His PSA is up a bit more but his exam is benign. I discussed options and will add finasteride. Side effects reviewed in detail. he will return in 3 months for a flowrate and PVR with a PSA. , - 2020 (Stable), His PSA is in his usual range. I will have him return in 6 months with a PSA for an exam. , - 2020, His PSA is up a bit more. I will get and MRIP and if there are abnormalities he will need a biopsy. Otherwise he will return in 14months. , - 2019 (Worsening), His PSA was up to 7.04 with 25% f/t  ratio. I will repeat in a month with a week of abstinence. If it remains elevated I will have him get an MRIP and consider a biopsy. , - 2019 (Stable), His PSA is stable and his exam is benign. , - 2018 (Improving), His PSA is down to the lowest has been in some time. , - 2017, Elevated prostate specific antigen (PSA), - 2016 Nocturia, I discussed a possible change to silodosin or doubling the tamsulosin but he wants to stay the course. I discussed dietary irritant avoidance as well. - 12/14/2021, - 11/17/2021, - 12/13/2020, - 12/15/2019, Nocturia, - 2016 Left renal neoplasm - 11/17/2021, - 10/11/2021, - 09/21/2021, - 06/01/2021 Incomplete bladder emptying - 2019 Weak Urinary Stream (Worsening) - 2019 Gross hematuria, He had an episode of hematuria related to voiding against an obstruction today.  I am going to just repeat a UA in 1 month and then have him return in 6 months. His BP is up because of the anxiety from the blood. - 2018 Urinary Frequency (Stable), He continues to use hyocyamine for the frequency and urgency. I have recommended he try prelief as well since the symptoms seem related to dietary irritants that he doesn't want to stop. - 2018 Urinary Urgency (Worsening) - 2017 Urinary Tract Inf, Unspec site, Pyuria - 2014      PMH Notes: COVID shots-Moderna- 01/31/2019, 03/03/2019  Pneumnovax-04/2019   NON-GU PMH: Hypertension - 2019, Hypertension, - 2014 Encounter for general adult medical examination without abnormal findings, Encounter for preventive health examination - 2016 Personal history of other endocrine, nutritional and metabolic disease, History of hypercholesterolemia - 2014  Skin Cancer, History    FAMILY HISTORY: Acute Myocardial Infarction - Father Creutzfeldt-Jakob Disease - Mother Death In The Family Father - Father Death In The Family Mother - Mother Neurofibromatosis - Runs In Family   SOCIAL HISTORY: Marital Status: Single Current Smoking Status: Patient has never  smoked.   Tobacco Use Assessment Completed: Used Tobacco in last 30 days? Does not use smokeless tobacco. Does drink.  Does not use drugs. Drinks 1 caffeinated drink per day. Has not had a blood transfusion. Patient's occupation is/was Retired.     Notes: Caffeine Use, Never smoker, Occupation:, Marital History - Single, Alcohol Use minimal , Tobacco Use  Rare ETOH   REVIEW OF SYSTEMS:    GU Review Male:   Patient denies frequent urination, hard to postpone urination, burning/ pain with urination, get up at night to urinate, leakage of urine, stream starts and stops, trouble starting your stream, have to strain to urinate , erection problems, and penile pain.  Gastrointestinal (Upper):   Patient denies nausea, vomiting, and indigestion/ heartburn.  Gastrointestinal (Lower):   Patient denies diarrhea and constipation.  Constitutional:   Patient denies fever, night sweats, weight loss, and fatigue.  Skin:   Patient denies skin rash/ lesion and itching.  Eyes:   Patient denies blurred vision and double vision.  Ears/ Nose/ Throat:   Patient denies sore throat and sinus problems.  Hematologic/Lymphatic:   Patient denies swollen glands and easy bruising.  Cardiovascular:   Patient denies leg swelling and chest pains.  Respiratory:   Patient denies cough and shortness of breath.  Endocrine:   Patient denies excessive thirst.  Musculoskeletal:   Patient denies back pain and joint pain.  Neurological:   Patient denies headaches and dizziness.  Psychologic:   Patient denies depression and anxiety.   VITAL SIGNS:      03/14/2022 02:59 PM  Weight 172 lb / 78.02 kg  Height 68 in / 172.72 cm  BP 163/74 mmHg  Heart Rate 66 /min  Temperature 97.0 F / 36.1 C  BMI 26.1 kg/m   MULTI-SYSTEM PHYSICAL EXAMINATION:    Constitutional: Well-nourished. No physical deformities. Normally developed. Good grooming.  Neck: Neck symmetrical, not swollen. Normal tracheal position.  Respiratory: Normal  breath sounds. No labored breathing, no use of accessory muscles.   Cardiovascular: Regular rate and rhythm. No murmur, no gallop.   Lymphatic: No enlargement of neck, axillae, groin.  Skin: No paleness, no jaundice, no cyanosis. No lesion, no ulcer, no rash.  Neurologic / Psychiatric: Oriented to time, oriented to place, oriented to person. No depression, no anxiety, no agitation.  Gastrointestinal: No mass, no tenderness, no rigidity, non obese abdomen.  Eyes: Normal conjunctivae. Normal eyelids.  Ears, Nose, Mouth, and Throat: Left ear no scars, no lesions, no masses. Right ear no scars, no lesions, no masses. Nose no scars, no lesions, no masses. Normal hearing. Normal lips.  Musculoskeletal: Normal gait and station of head and neck.     Complexity of Data:  Records Review:   Previous Patient Records  Urine Test Review:   Urinalysis   03/14/22  Urinalysis  Urine Appearance Clear   Urine Color Yellow   Urine Glucose Neg mg/dL  Urine Bilirubin Neg mg/dL  Urine Ketones Neg mg/dL  Urine Specific Gravity 1.020   Urine Blood Neg ery/uL  Urine pH 6.5   Urine Protein Trace mg/dL  Urine Urobilinogen 0.2 mg/dL  Urine Nitrites Neg   Urine Leukocyte Esterase Neg leu/uL   PROCEDURES:          Urinalysis - 81003 Dipstick Dipstick Cont'd  Color: Yellow Bilirubin: Neg mg/dL  Appearance: Clear Ketones: Neg mg/dL  Specific Gravity: 1.020 Blood: Neg ery/uL  pH: 6.5 Protein: Trace mg/dL  Glucose: Neg mg/dL Urobilinogen: 0.2 mg/dL    Nitrites: Neg    Leukocyte Esterase: Neg leu/uL    ASSESSMENT:      ICD-10 Details  1 GU:   Left renal neoplasm - D49.512    PLAN:           Schedule Return Visit/Planned Activity: Keep Scheduled Appointment - Schedule Surgery          Document Letter(s):  Created for Patient: Clinical Summary         Notes:   There are no changes in the patients history or physical exam since last evaluation by Dr. Abner Greenspan. Pt is scheduled to undergo RAL left partial  nephrectomy on 03/27/22.   All pt's questions were answered to the best of my ability.          Next Appointment:      Next Appointment: 03/15/2022 11:45 AM    Appointment Type: Laboratory Appointment    Location: [facility] [phone]    Provider: Lab LAB    Reason for Visit: 3 mo psa   Urology Preoperative H&P   Chief Complaint: Left renal mass  History of Present Illness: Evan Calderon is a 67 y.o. male with a left renal mass here for left robotic assisted laparoscopic partial nephrectomy. Denies fevers, chills, dysuria.    Past Medical History:  Diagnosis Date   GERD (gastroesophageal reflux disease)    Hypertension     Past Surgical History:  Procedure Laterality Date   COLONOSCOPY     COLONOSCOPY WITH PROPOFOL N/A 03/02/2015   Procedure: COLONOSCOPY WITH PROPOFOL;  Surgeon: Garlan Fair, MD;  Location: [facility];  Service: Endoscopy;  Laterality: N/A;   GANGLION CYST EXCISION Left    left wrist   PROSTATE BIOPSY     biopsy prostate -benign    Allergies:  Allergies  Allergen Reactions   Lactose Intolerance (Gi) Other (See Comments)   Aleve [Naproxen] Swelling    Facial swelling   Aspirin Swelling    Facial swelling   Niacin And Related Other (See Comments)    Severe flushing   Pravastatin Other (See Comments)    Muscle aches   Salicylates Swelling    Facial swelling   Ciprofloxacin Itching and Rash    History reviewed. No pertinent family history.  Social History:  reports that he has never smoked. He has never used smokeless tobacco. He reports current alcohol use. He reports that he does not  use drugs.  ROS: A complete review of systems was performed.  All systems are negative except for pertinent findings as noted.  Physical Exam:  Vital signs in last 24 hours: Temp:  [98.2 F (36.8 C)] 98.2 F (36.8 C) (03/25 0551) Pulse Rate:  [65] 65 (03/25 0551) Resp:  [16] 16 (03/25 0551) BP: (153)/(86) 153/86 (03/25  0551) SpO2:  [97 %] 97 % (03/25 0551) Constitutional:  Alert and oriented, No acute distress Cardiovascular: Regular rate and rhythm Respiratory: Normal respiratory effort, Lungs clear bilaterally GI: Abdomen is soft, nontender, nondistended, no abdominal masses GU: No CVA tenderness Lymphatic: No lymphadenopathy Neurologic: Grossly intact, no focal deficits Psychiatric: Normal mood and affect  Laboratory Data:  No results for input(s): "WBC", "HGB", "HCT", "PLT" in the last 72 hours.  No results for input(s): "NA", "K", "CL", "GLUCOSE", "BUN", "CALCIUM", "CREATININE" in the last 72 hours.  Invalid input(s): "CO3"   No results found for this or any previous visit (from the past 24 hour(s)). No results found for this or any previous visit (from the past 240 hour(s)).  Renal Function: No results for input(s): "CREATININE" in the last 168 hours. Estimated Creatinine Clearance: 64.9 mL/min (by C-G formula based on SCr of 1.04 mg/dL).  Radiologic Imaging: No results found.  I independently reviewed the above imaging studies.  Assessment and Plan Evan Calderon is a 67 y.o. male with a left renal mass here for left robotic assisted laparoscopic partial nephrectomy.   We have discussed the risks of treatment in detail including but not limited to bleeding, infection, heart attack, stroke, death, venothromoboembolism, cancer recurrence, injury/damage to surrounding organs and structures, urine leak, the possibility of open surgical conversion for patients undergoing minimally invasive surgery, the risk of developing chronic kidney disease and its associated implications, and the potential risk of end stage renal disease possibly necessitating dialysis.   Matt R. Faythe Heitzenrater MD 03/27/2022, 7:14 AM  [facility] Pager: [phone]): [phone]

## 2022-03-27 NOTE — Anesthesia Postprocedure Evaluation (Signed)
Anesthesia Post Note  Patient: Evan Calderon  Procedure(s) Performed: XI LEFT ROBOTIC ASSISTED LAPAROSCOPIC PARTIAL NEPHRECTOMY (Left)     Patient location during evaluation: PACU Anesthesia Type: General Level of consciousness: awake Pain management: pain level controlled Vital Signs Assessment: post-procedure vital signs reviewed and stable Respiratory status: spontaneous breathing, nonlabored ventilation and respiratory function stable Cardiovascular status: blood pressure returned to baseline and stable Postop Assessment: no apparent nausea or vomiting Anesthetic complications: no   No notable events documented.  Last Vitals:  Vitals:   03/27/22 1215 03/27/22 1230  BP: 124/78 118/73  Pulse: 63 62  Resp: 14 14  Temp: (!) 36.3 C   SpO2: 99% 96%    Last Pain:  Vitals:   03/27/22 1230  TempSrc:   PainSc: 3                  Nilda Simmer

## 2022-03-27 NOTE — Care Management CC44 (Signed)
Condition Code 44 Documentation Completed  Patient Details  Name: Evan Calderon MRN: [id number] Date of Birth: 1955-04-11   Condition Code 44 given:  Yes Patient signature on Condition Code 44 notice:  Yes Documentation of 2 MD's agreement:  Yes Code 44 added to claim:  Yes    Phyllis Ginger, RN 03/27/2022, 2:27 PM

## 2022-03-27 NOTE — Op Note (Signed)
Operative Note  Preoperative diagnosis:  1.  Left renal mass  Postoperative diagnosis: 1.  Left renal mass  Procedure(s): 1.   Left robotic assisted laparoscopic partial nephrectomy 2. Intraoperative ultrasound of single retroperitoneal organ with intraoperative interpretation  Surgeon: Rexene Alberts, MD  Assistants:  Debbrah Alar, PA  An assistant was required for this surgical procedure.  The duties of the assistant included but were not limited to suctioning, passing suture, camera manipulation, retraction.  This procedure would not be able to be performed without an Environmental consultant.   Resident: Dr. Josph Macho, MD, PGY-4  Anesthesia:  General  Complications:  None  EBL:  115ml  Specimens: 1.  ID Type Source Tests Collected by Time Destination  1 : Left Renal Mass Tissue PATH GU resection / TURBT / partial nephrectomy SURGICAL PATHOLOGY Evan Lima, MD 03/27/2022 [phone]     Drains/Catheters: 1.  15 Fr blake drain  Intraoperative findings:   Approximately 3cm left lower pole mass. 1 artery and 1 vein. Mass excised with grossly negative surgical margins. Excellent hemostasis.  Indication:  Evan Calderon is a 67 y.o. male with a left renal mass seen on preoperative imaging. He has had some delay in scheduling surgery as he was caring for his ailing brother. After thorough discussion including all relevant risk benefits and alternatives, the patient presents to the operating today for a left robotic assisted laparoscopic partial nephrectomy.  Description of procedure: The indications, alternatives, benefits and risks were discussed with the patient and informed consent was obtained.  The patient was brought onto the operating room table, positioned supine and secured to the bed with a safety strap.  All pressure points were carefully padded and pneumatic compression devices were placed on the lower extremities.  After the administration of intravenous antibiotics and general  endotracheal anesthesia, a 16 French urethral catheter was inserted to drain the bladder.  The patient was repositioned in the right lateral decubitus with the left side elevated at a 70 degree angle with the right lower leg flexed and the left upper leg extended.  An axillary roll was positioned to protect the brachial plexus and a gel pad was placed to support the back.  Multiple pillows were used to pad beneath and between the lower extremities and to ensure adequate cushioning. The left arm was placed in an armrest and carefully padded. The patient was secured in place across the hips, chest and legs with foam padding and silk tape, and the table was flexed.  The patient was prepped and draped in the standard sterile manner.  The radiographic images were in the room.  Timeout was completed verifying the correct patient, surgical procedure, site and positioning, prior to beginning the procedure.  Pneumoperitoneum was introduced by placing a Veress needle just lateral to the rectus belly into the abdomen and insufflating with CO2 to a pressure of 15 mmHg.  The camera trocar was placed approximately 7-1/2 cm inferior and 2 cm medially to the planned position of the left robotic arm which was approximately 2 fingerbreadths inferior to the costal margin.  The 0 degree lens was inserted under direct visualization.  The abdominal cavity was examined for any signs of injury, adhesions and identification of anatomic landmarks. We then placed our right robotic trocar, left robotic trocar and fourth arm trocar.  A 12 mm assistant port was placed periumbilically.  The robot was then docked.  The white line of Toldt was incised and the colon was reflected medially, exposing Gerota's  fascia.  The splenorenal and splenophrenic ligaments were incised to mobilize the spleen.  The tail of the pancreas was mobilized medially.  The gonadal vein and ureter were identified and the dissection carried cephalad towards the renal  vein.  The renal and lumbar veins were visualized entering the left renal vein.  The main renal artery was identified deep to the vein and carefully dissected.  A vessel loop was placed around the left renal artery and secured with a Weck clip.  We then incised Gerota's fascia over anterior and lower pole of the kidney at the expected location of the mass.  The renal mass was identified at the right lower pole. The kidney was then mobilized entirely posteriorly and laterally. Intraoperative ultrasound was used to facilitate identification of the mass and its margins.  A circumferential margin around the mass was scored with electrocauterization.  The laparoscopic bulldog clamps 1 curved was then placed on the left renal artery and a timer tracking the ischemia time was started.  The renal mass was sharply excised and placed aside in a specimen retrieval bag. There was no collecting system defect visualized. Next, 3-0 V-Lock sutures x 2 were then used to control the transected parenchymal vessels in the resection bed.  The bulldog clamp was then released.  There was a total ischemic time of 13 minutes.  Next, 2-0 Vicryl sutures on an SH needle were then used to reconstruct the renal parenchyma in a sliding Weck fashion.  3 sutures were used.  These were then secured with Weck clips and a Lapra-Ty.  Hemostasis was excellent. Pneumoperitoneum was reduced to a pressure of 6mmHg. There is no further bleeding.  We then use Vistaseal over the renorrhaphy. Floseal was placed around the hilum. The anterior fascia was reapproximated with weck sutures being careful not to incorporate the ureter in the closure.  The operative field was inspected for bleeding or injury.  The insufflation pressure was reduced, again confirming the absence of bleeding.  Pneumoperitoneum was reestablished and a Blake drain was placed through a laparoscopic port into the perirenal space, away from the repair, and secured at the skin.  The  robot was undocked and removed from the operative field.  The trochars were removed under direct visualization.  The midline incision was extended and the specimen retrieval bag was removed.  The specimen was sent to pathology for evaluation.  The anterior fascia at the periumbilical site was carefully closed with PDS sutures and a total of 40 mL of Exparel was injected subcutaneously at the port sites.  The skin incisions were closed with 4-0 Monocryl sutures.  Dermabond was applied.  The patient was repositioned supine.  At the end of the procedure, all counts were correct.  The patient tolerated the procedure well and was taken to recovery in stable condition.  Matt R. [facility]  Pager: [phone]

## 2022-03-28 ENCOUNTER — Encounter (HOSPITAL_COMMUNITY): Payer: Self-pay | Admitting: Urology

## 2022-03-28 DIAGNOSIS — I10 Essential (primary) hypertension: Secondary | ICD-10-CM | POA: Diagnosis not present

## 2022-03-28 DIAGNOSIS — N2889 Other specified disorders of kidney and ureter: Secondary | ICD-10-CM | POA: Diagnosis not present

## 2022-03-28 DIAGNOSIS — Z79899 Other long term (current) drug therapy: Secondary | ICD-10-CM | POA: Diagnosis not present

## 2022-03-28 DIAGNOSIS — Z85828 Personal history of other malignant neoplasm of skin: Secondary | ICD-10-CM | POA: Diagnosis not present

## 2022-03-28 LAB — BASIC METABOLIC PANEL
Anion gap: 8 (ref 5–15)
BUN: 18 mg/dL (ref 8–23)
CO2: 22 mmol/L (ref 22–32)
Calcium: 8.3 mg/dL — ABNORMAL LOW (ref 8.9–10.3)
Chloride: 104 mmol/L (ref 98–111)
Creatinine, Ser: 0.92 mg/dL (ref 0.61–1.24)
GFR, Estimated: >60 mL/min (ref >60)
Glucose, Bld: 119 mg/dL — ABNORMAL HIGH (ref 70–99)
Potassium: 3.8 mmol/L (ref 3.5–5.1)
Sodium: 134 mmol/L — ABNORMAL LOW (ref 135–145)

## 2022-03-28 LAB — CREATININE, FLUID (PLEURAL, PERITONEAL, JP DRAINAGE): Creat, Fluid: 0.9 mg/dL

## 2022-03-28 LAB — SURGICAL PATHOLOGY

## 2022-03-28 LAB — HEMOGLOBIN AND HEMATOCRIT, BLOOD
HCT: 40.4 % (ref 39.0–52.0)
Hemoglobin: 13.3 g/dL (ref 13.0–17.0)

## 2022-03-28 MED ORDER — HYDROMORPHONE HCL 1 MG/ML IJ SOLN: 0.5000 mg | INTRAMUSCULAR | Status: DC | PRN

## 2022-03-28 NOTE — Progress Notes (Signed)
Patient has ambulated around 800 feet in hallway and is currently ambulating independently in his room. Tolerating well. Pain well controlled with PO medications. JP drain removed with no complications. FC removed and patient has voided [id number] with no complications. D/C instructions explained to patient who verbalized understanding. Awaiting transportation at this point- will continue to monitor.

## 2022-03-28 NOTE — Progress Notes (Signed)
  Transition of Care [facility]) Screening Note   Patient Details  Name: FAIZAN EDHOLM Date of Birth: 1955-03-20   Transition of Care [facility]) CM/SW Contact:    Phyllis Ginger, RN Phone Number: 03/28/2022, 11:52 AM    Transition of Care Department [facility]) has reviewed patient and no TOC needs have been identified at this time. We will continue to monitor patient advancement through interdisciplinary progression rounds. If new patient transition needs arise, please place a TOC consult.

## 2022-04-04 DIAGNOSIS — D49512 Neoplasm of unspecified behavior of left kidney: Secondary | ICD-10-CM | POA: Diagnosis not present

## 2022-04-11 DIAGNOSIS — Z23 Encounter for immunization: Secondary | ICD-10-CM | POA: Diagnosis not present

## 2022-06-19 DIAGNOSIS — R1010 Upper abdominal pain, unspecified: Secondary | ICD-10-CM | POA: Diagnosis not present

## 2022-06-19 DIAGNOSIS — R634 Abnormal weight loss: Secondary | ICD-10-CM | POA: Diagnosis not present

## 2022-06-19 DIAGNOSIS — R195 Other fecal abnormalities: Secondary | ICD-10-CM | POA: Diagnosis not present

## 2022-06-27 DIAGNOSIS — D49512 Neoplasm of unspecified behavior of left kidney: Secondary | ICD-10-CM | POA: Diagnosis not present

## 2022-06-27 DIAGNOSIS — R972 Elevated prostate specific antigen [PSA]: Secondary | ICD-10-CM | POA: Diagnosis not present

## 2022-06-29 DIAGNOSIS — K5730 Diverticulosis of large intestine without perforation or abscess without bleeding: Secondary | ICD-10-CM | POA: Diagnosis not present

## 2022-06-29 DIAGNOSIS — K7689 Other specified diseases of liver: Secondary | ICD-10-CM | POA: Diagnosis not present

## 2022-06-29 DIAGNOSIS — C642 Malignant neoplasm of left kidney, except renal pelvis: Secondary | ICD-10-CM | POA: Diagnosis not present

## 2022-06-29 DIAGNOSIS — D49512 Neoplasm of unspecified behavior of left kidney: Secondary | ICD-10-CM | POA: Diagnosis not present

## 2022-06-29 DIAGNOSIS — N281 Cyst of kidney, acquired: Secondary | ICD-10-CM | POA: Diagnosis not present

## 2022-07-04 DIAGNOSIS — D49512 Neoplasm of unspecified behavior of left kidney: Secondary | ICD-10-CM | POA: Diagnosis not present

## 2022-07-04 DIAGNOSIS — R972 Elevated prostate specific antigen [PSA]: Secondary | ICD-10-CM | POA: Diagnosis not present

## 2022-07-04 DIAGNOSIS — N401 Enlarged prostate with lower urinary tract symptoms: Secondary | ICD-10-CM | POA: Diagnosis not present

## 2022-07-04 DIAGNOSIS — R351 Nocturia: Secondary | ICD-10-CM | POA: Diagnosis not present

## 2022-07-20 DIAGNOSIS — K2930 Chronic superficial gastritis without bleeding: Secondary | ICD-10-CM | POA: Diagnosis not present

## 2022-07-20 DIAGNOSIS — K317 Polyp of stomach and duodenum: Secondary | ICD-10-CM | POA: Diagnosis not present

## 2022-07-20 DIAGNOSIS — R1010 Upper abdominal pain, unspecified: Secondary | ICD-10-CM | POA: Diagnosis not present

## 2022-07-20 DIAGNOSIS — K209 Esophagitis, unspecified without bleeding: Secondary | ICD-10-CM | POA: Diagnosis not present

## 2022-07-20 DIAGNOSIS — K2970 Gastritis, unspecified, without bleeding: Secondary | ICD-10-CM | POA: Diagnosis not present

## 2022-07-24 DIAGNOSIS — K2930 Chronic superficial gastritis without bleeding: Secondary | ICD-10-CM | POA: Diagnosis not present

## 2022-07-24 DIAGNOSIS — K209 Esophagitis, unspecified without bleeding: Secondary | ICD-10-CM | POA: Diagnosis not present

## 2022-09-08 DIAGNOSIS — Z6827 Body mass index (BMI) 27.0-27.9, adult: Secondary | ICD-10-CM | POA: Diagnosis not present

## 2022-09-08 DIAGNOSIS — I10 Essential (primary) hypertension: Secondary | ICD-10-CM | POA: Diagnosis not present

## 2022-09-08 DIAGNOSIS — M25562 Pain in left knee: Secondary | ICD-10-CM | POA: Diagnosis not present

## 2022-09-20 DIAGNOSIS — M2141 Flat foot [pes planus] (acquired), right foot: Secondary | ICD-10-CM | POA: Diagnosis not present

## 2022-09-20 DIAGNOSIS — M25562 Pain in left knee: Secondary | ICD-10-CM | POA: Diagnosis not present

## 2022-09-20 DIAGNOSIS — M2142 Flat foot [pes planus] (acquired), left foot: Secondary | ICD-10-CM | POA: Diagnosis not present

## 2022-10-07 DIAGNOSIS — Z23 Encounter for immunization: Secondary | ICD-10-CM | POA: Diagnosis not present

## 2022-10-17 DIAGNOSIS — M25662 Stiffness of left knee, not elsewhere classified: Secondary | ICD-10-CM | POA: Diagnosis not present

## 2022-10-17 DIAGNOSIS — M1712 Unilateral primary osteoarthritis, left knee: Secondary | ICD-10-CM | POA: Diagnosis not present

## 2022-10-17 DIAGNOSIS — R531 Weakness: Secondary | ICD-10-CM | POA: Diagnosis not present

## 2022-10-19 DIAGNOSIS — M25662 Stiffness of left knee, not elsewhere classified: Secondary | ICD-10-CM | POA: Diagnosis not present

## 2022-10-19 DIAGNOSIS — R531 Weakness: Secondary | ICD-10-CM | POA: Diagnosis not present

## 2022-10-19 DIAGNOSIS — M1712 Unilateral primary osteoarthritis, left knee: Secondary | ICD-10-CM | POA: Diagnosis not present

## 2022-11-06 DIAGNOSIS — M25662 Stiffness of left knee, not elsewhere classified: Secondary | ICD-10-CM | POA: Diagnosis not present

## 2022-11-06 DIAGNOSIS — M1712 Unilateral primary osteoarthritis, left knee: Secondary | ICD-10-CM | POA: Diagnosis not present

## 2022-11-06 DIAGNOSIS — R531 Weakness: Secondary | ICD-10-CM | POA: Diagnosis not present

## 2022-11-09 DIAGNOSIS — M1712 Unilateral primary osteoarthritis, left knee: Secondary | ICD-10-CM | POA: Diagnosis not present

## 2022-11-09 DIAGNOSIS — M25662 Stiffness of left knee, not elsewhere classified: Secondary | ICD-10-CM | POA: Diagnosis not present

## 2022-11-09 DIAGNOSIS — R531 Weakness: Secondary | ICD-10-CM | POA: Diagnosis not present

## 2022-11-13 DIAGNOSIS — R531 Weakness: Secondary | ICD-10-CM | POA: Diagnosis not present

## 2022-11-13 DIAGNOSIS — M1712 Unilateral primary osteoarthritis, left knee: Secondary | ICD-10-CM | POA: Diagnosis not present

## 2022-11-13 DIAGNOSIS — M25662 Stiffness of left knee, not elsewhere classified: Secondary | ICD-10-CM | POA: Diagnosis not present

## 2022-11-16 DIAGNOSIS — M25662 Stiffness of left knee, not elsewhere classified: Secondary | ICD-10-CM | POA: Diagnosis not present

## 2022-11-16 DIAGNOSIS — M1712 Unilateral primary osteoarthritis, left knee: Secondary | ICD-10-CM | POA: Diagnosis not present

## 2022-11-16 DIAGNOSIS — R531 Weakness: Secondary | ICD-10-CM | POA: Diagnosis not present

## 2022-11-20 DIAGNOSIS — L570 Actinic keratosis: Secondary | ICD-10-CM | POA: Diagnosis not present

## 2022-11-20 DIAGNOSIS — L821 Other seborrheic keratosis: Secondary | ICD-10-CM | POA: Diagnosis not present

## 2022-11-20 DIAGNOSIS — D2371 Other benign neoplasm of skin of right lower limb, including hip: Secondary | ICD-10-CM | POA: Diagnosis not present

## 2022-11-20 DIAGNOSIS — L905 Scar conditions and fibrosis of skin: Secondary | ICD-10-CM | POA: Diagnosis not present

## 2022-11-20 DIAGNOSIS — Z85828 Personal history of other malignant neoplasm of skin: Secondary | ICD-10-CM | POA: Diagnosis not present

## 2022-11-20 DIAGNOSIS — L814 Other melanin hyperpigmentation: Secondary | ICD-10-CM | POA: Diagnosis not present

## 2022-12-06 DIAGNOSIS — M79662 Pain in left lower leg: Secondary | ICD-10-CM | POA: Diagnosis not present

## 2022-12-06 DIAGNOSIS — M25572 Pain in left ankle and joints of left foot: Secondary | ICD-10-CM | POA: Diagnosis not present

## 2022-12-06 DIAGNOSIS — M25562 Pain in left knee: Secondary | ICD-10-CM | POA: Diagnosis not present

## 2022-12-27 ENCOUNTER — Other Ambulatory Visit: Payer: Self-pay | Admitting: Urology

## 2023-01-01 DIAGNOSIS — R972 Elevated prostate specific antigen [PSA]: Secondary | ICD-10-CM | POA: Diagnosis not present

## 2023-01-08 DIAGNOSIS — R351 Nocturia: Secondary | ICD-10-CM | POA: Diagnosis not present

## 2023-01-08 DIAGNOSIS — R972 Elevated prostate specific antigen [PSA]: Secondary | ICD-10-CM | POA: Diagnosis not present

## 2023-01-08 DIAGNOSIS — D49512 Neoplasm of unspecified behavior of left kidney: Secondary | ICD-10-CM | POA: Diagnosis not present

## 2023-01-08 DIAGNOSIS — N401 Enlarged prostate with lower urinary tract symptoms: Secondary | ICD-10-CM | POA: Diagnosis not present

## 2023-01-15 ENCOUNTER — Other Ambulatory Visit: Payer: Self-pay | Admitting: Urology

## 2023-01-15 DIAGNOSIS — R972 Elevated prostate specific antigen [PSA]: Secondary | ICD-10-CM

## 2023-03-01 DIAGNOSIS — E559 Vitamin D deficiency, unspecified: Secondary | ICD-10-CM | POA: Diagnosis not present

## 2023-03-01 DIAGNOSIS — E782 Mixed hyperlipidemia: Secondary | ICD-10-CM | POA: Diagnosis not present

## 2023-03-01 DIAGNOSIS — R7301 Impaired fasting glucose: Secondary | ICD-10-CM | POA: Diagnosis not present

## 2023-03-01 DIAGNOSIS — I10 Essential (primary) hypertension: Secondary | ICD-10-CM | POA: Diagnosis not present

## 2023-03-02 ENCOUNTER — Ambulatory Visit
Admission: RE | Admit: 2023-03-02 | Discharge: 2023-03-02 | Disposition: A | Discharge status: Home / Self Care | Payer: Medicare Other | Source: Ambulatory Visit | Attending: Urology | Admitting: Urology

## 2023-03-02 DIAGNOSIS — R972 Elevated prostate specific antigen [PSA]: Secondary | ICD-10-CM

## 2023-03-02 MED ORDER — GADOPICLENOL 0.5 MMOL/ML IV SOLN: 8.0000 mL | Freq: Once | INTRAVENOUS | Status: DC | PRN

## 2023-03-02 MED ORDER — GADOPICLENOL 0.5 MMOL/ML IV SOLN
8.0000 mL | Freq: Once | INTRAVENOUS | Status: AC | PRN
  Administered 2023-03-02: 8 mL via INTRAVENOUS

## 2023-03-06 ENCOUNTER — Other Ambulatory Visit (HOSPITAL_COMMUNITY): Payer: Self-pay | Admitting: Urology

## 2023-03-06 ENCOUNTER — Ambulatory Visit (HOSPITAL_COMMUNITY)
Admission: RE | Admit: 2023-03-06 | Discharge: 2023-03-06 | Disposition: A | Discharge status: Home / Self Care | Source: Ambulatory Visit | Attending: Urology | Admitting: Urology

## 2023-03-06 DIAGNOSIS — D49512 Neoplasm of unspecified behavior of left kidney: Secondary | ICD-10-CM

## 2023-03-06 DIAGNOSIS — Z85528 Personal history of other malignant neoplasm of kidney: Secondary | ICD-10-CM | POA: Diagnosis not present

## 2023-03-07 DIAGNOSIS — Z6827 Body mass index (BMI) 27.0-27.9, adult: Secondary | ICD-10-CM | POA: Diagnosis not present

## 2023-03-07 DIAGNOSIS — E782 Mixed hyperlipidemia: Secondary | ICD-10-CM | POA: Diagnosis not present

## 2023-03-07 DIAGNOSIS — E559 Vitamin D deficiency, unspecified: Secondary | ICD-10-CM | POA: Diagnosis not present

## 2023-03-07 DIAGNOSIS — R7301 Impaired fasting glucose: Secondary | ICD-10-CM | POA: Diagnosis not present

## 2023-03-07 DIAGNOSIS — Z0000 Encounter for general adult medical examination without abnormal findings: Secondary | ICD-10-CM | POA: Diagnosis not present

## 2023-03-07 DIAGNOSIS — I10 Essential (primary) hypertension: Secondary | ICD-10-CM | POA: Diagnosis not present

## 2023-03-22 DIAGNOSIS — N289 Disorder of kidney and ureter, unspecified: Secondary | ICD-10-CM | POA: Diagnosis not present

## 2023-03-22 DIAGNOSIS — Z905 Acquired absence of kidney: Secondary | ICD-10-CM | POA: Diagnosis not present

## 2023-03-22 DIAGNOSIS — D49512 Neoplasm of unspecified behavior of left kidney: Secondary | ICD-10-CM | POA: Diagnosis not present

## 2023-04-04 DIAGNOSIS — R972 Elevated prostate specific antigen [PSA]: Secondary | ICD-10-CM | POA: Diagnosis not present

## 2023-04-04 DIAGNOSIS — N410 Acute prostatitis: Secondary | ICD-10-CM | POA: Diagnosis not present

## 2023-04-10 DIAGNOSIS — Z23 Encounter for immunization: Secondary | ICD-10-CM | POA: Diagnosis not present

## 2023-04-11 DIAGNOSIS — R351 Nocturia: Secondary | ICD-10-CM | POA: Diagnosis not present

## 2023-04-11 DIAGNOSIS — R972 Elevated prostate specific antigen [PSA]: Secondary | ICD-10-CM | POA: Diagnosis not present

## 2023-04-11 DIAGNOSIS — D49512 Neoplasm of unspecified behavior of left kidney: Secondary | ICD-10-CM | POA: Diagnosis not present

## 2023-04-11 DIAGNOSIS — N401 Enlarged prostate with lower urinary tract symptoms: Secondary | ICD-10-CM | POA: Diagnosis not present

## 2023-10-04 DIAGNOSIS — R972 Elevated prostate specific antigen [PSA]: Secondary | ICD-10-CM | POA: Diagnosis not present

## 2023-10-11 DIAGNOSIS — N400 Enlarged prostate without lower urinary tract symptoms: Secondary | ICD-10-CM | POA: Diagnosis not present

## 2023-10-11 DIAGNOSIS — C649 Malignant neoplasm of unspecified kidney, except renal pelvis: Secondary | ICD-10-CM | POA: Diagnosis not present

## 2023-10-11 DIAGNOSIS — R3914 Feeling of incomplete bladder emptying: Secondary | ICD-10-CM | POA: Diagnosis not present

## 2023-10-11 DIAGNOSIS — R972 Elevated prostate specific antigen [PSA]: Secondary | ICD-10-CM | POA: Diagnosis not present

## 2023-10-11 DIAGNOSIS — R3912 Poor urinary stream: Secondary | ICD-10-CM | POA: Diagnosis not present

## 2023-10-11 DIAGNOSIS — R350 Frequency of micturition: Secondary | ICD-10-CM | POA: Diagnosis not present

## 2023-10-11 DIAGNOSIS — R351 Nocturia: Secondary | ICD-10-CM | POA: Diagnosis not present

## 2023-10-11 DIAGNOSIS — N401 Enlarged prostate with lower urinary tract symptoms: Secondary | ICD-10-CM | POA: Diagnosis not present

## 2023-10-26 DIAGNOSIS — Z23 Encounter for immunization: Secondary | ICD-10-CM | POA: Diagnosis not present

## 2023-11-21 DIAGNOSIS — Z85828 Personal history of other malignant neoplasm of skin: Secondary | ICD-10-CM | POA: Diagnosis not present

## 2023-11-21 DIAGNOSIS — L821 Other seborrheic keratosis: Secondary | ICD-10-CM | POA: Diagnosis not present

## 2023-11-21 DIAGNOSIS — L570 Actinic keratosis: Secondary | ICD-10-CM | POA: Diagnosis not present

## 2023-11-21 DIAGNOSIS — D225 Melanocytic nevi of trunk: Secondary | ICD-10-CM | POA: Diagnosis not present

## 2023-11-22 ENCOUNTER — Other Ambulatory Visit (HOSPITAL_COMMUNITY): Payer: Self-pay | Admitting: Sports Medicine

## 2023-11-22 DIAGNOSIS — M79671 Pain in right foot: Secondary | ICD-10-CM | POA: Diagnosis not present

## 2023-11-22 DIAGNOSIS — M79674 Pain in right toe(s): Secondary | ICD-10-CM | POA: Diagnosis not present

## 2023-11-28 ENCOUNTER — Ambulatory Visit (HOSPITAL_COMMUNITY)
Admission: RE | Admit: 2023-11-28 | Discharge: 2023-11-28 | Disposition: A | Discharge status: Home / Self Care | Source: Ambulatory Visit | Attending: Sports Medicine | Admitting: Sports Medicine

## 2023-11-28 DIAGNOSIS — R600 Localized edema: Secondary | ICD-10-CM | POA: Diagnosis not present

## 2023-11-28 DIAGNOSIS — M79674 Pain in right toe(s): Secondary | ICD-10-CM | POA: Insufficient documentation

## 2023-12-12 DIAGNOSIS — M79671 Pain in right foot: Secondary | ICD-10-CM | POA: Diagnosis not present

## 2024-02-03 IMAGING — CT CT ABD-PELV W/ CM
1 of 3 series · 13 of 32 positions shown, 18 images · IV contrast (APPLIED)
Comparison: None Available.

CLINICAL DATA: Right upper quadrant pain

EXAM:
CT ABDOMEN AND PELVIS WITH CONTRAST
TECHNIQUE: Multidetector CT imaging of the abdomen and pelvis was performed
using the standard protocol following bolus administration of
intravenous contrast.

[Series 2: abd/pelvis w/cm · axial · 0.77mm/px · z∈[-428,-3]mm · 13 of 99 slices shown, 18 images]
[im 7/99  soft-tissue]
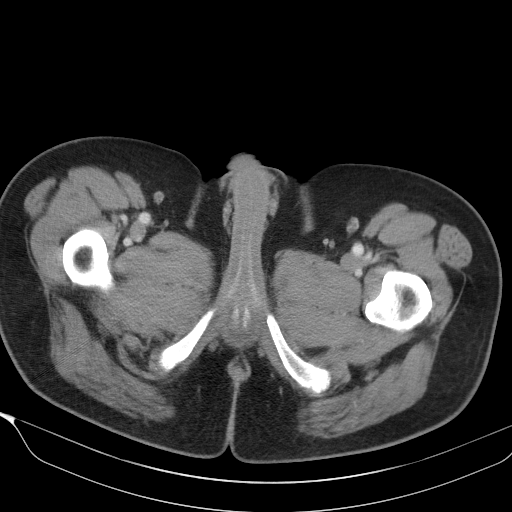
[im 7/99  bone]
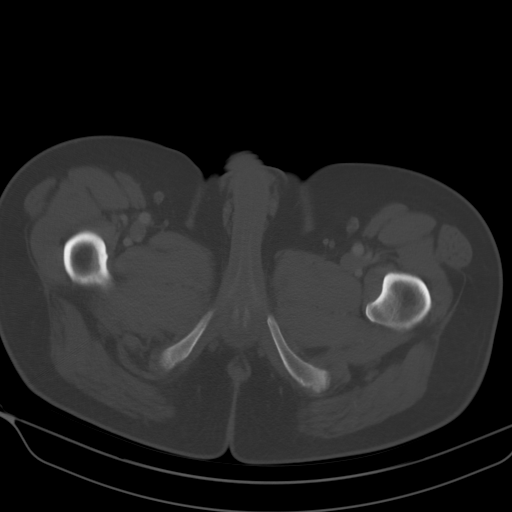
[im 13/99  soft-tissue]
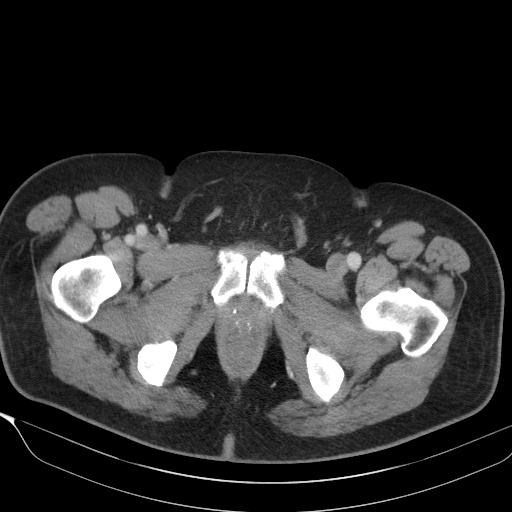
[im 25/99  soft-tissue]
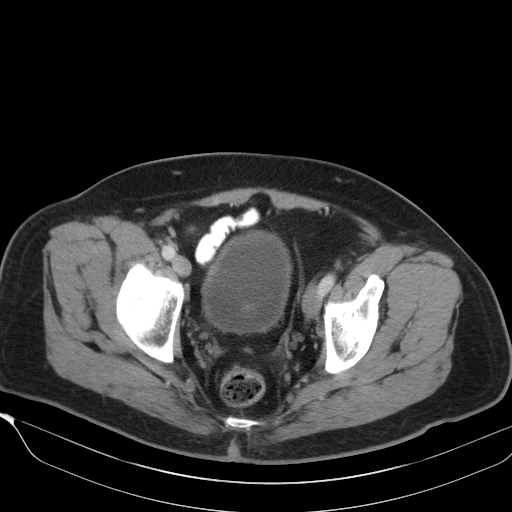
[im 31/99  soft-tissue]
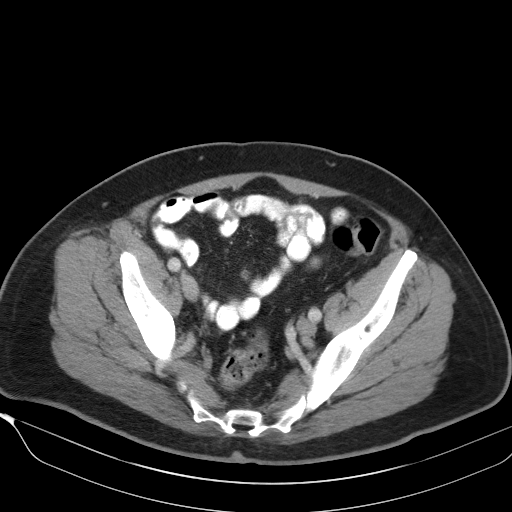
[im 37/99  soft-tissue]
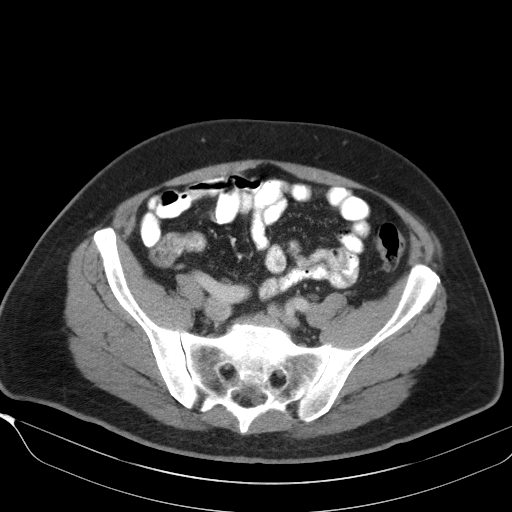
[im 43/99  soft-tissue]
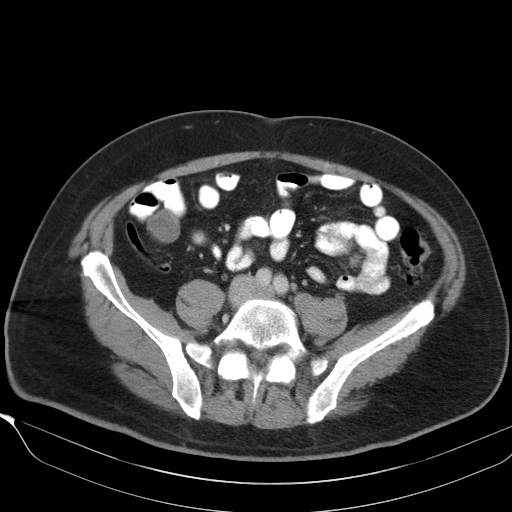
[im 56/99  soft-tissue]
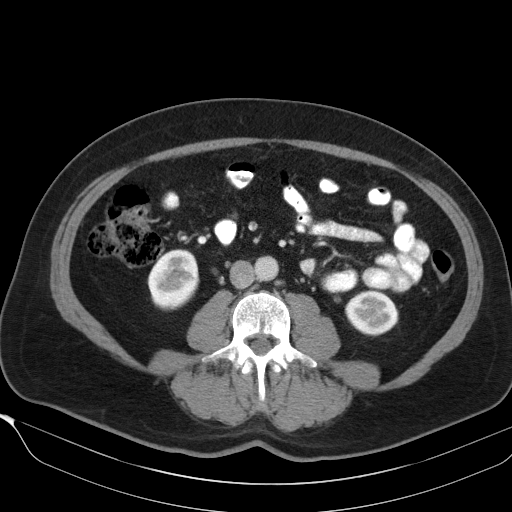
[im 62/99  soft-tissue]
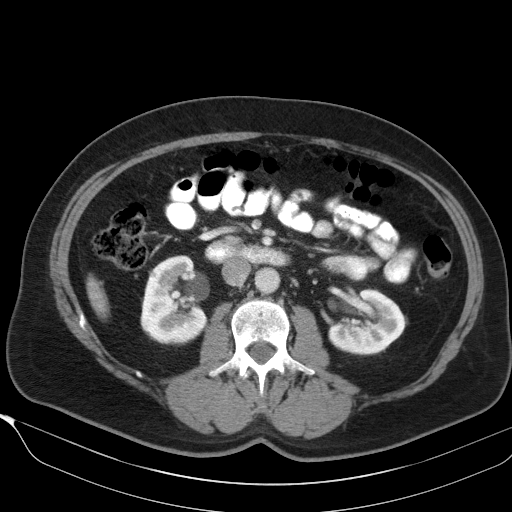
[im 68/99  soft-tissue]
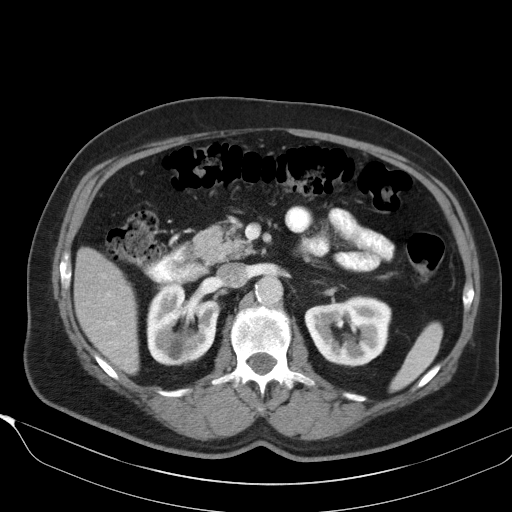
[im 68/99  bone]
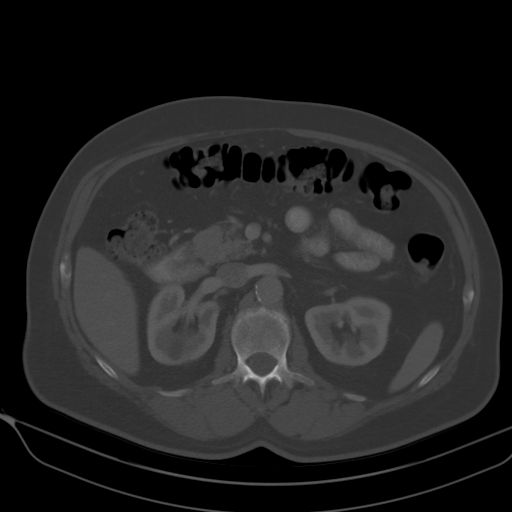
[im 74/99  soft-tissue]
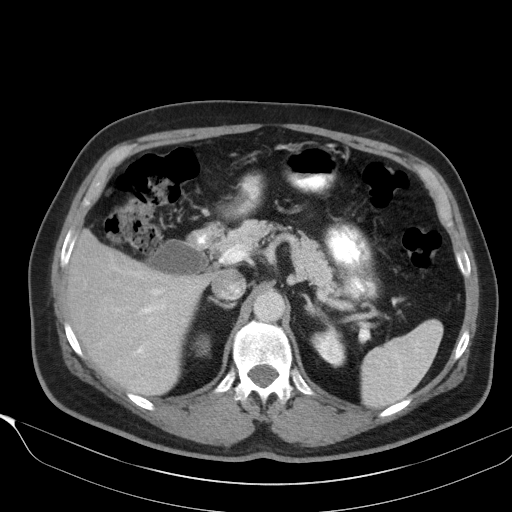
[im 74/99  lung]
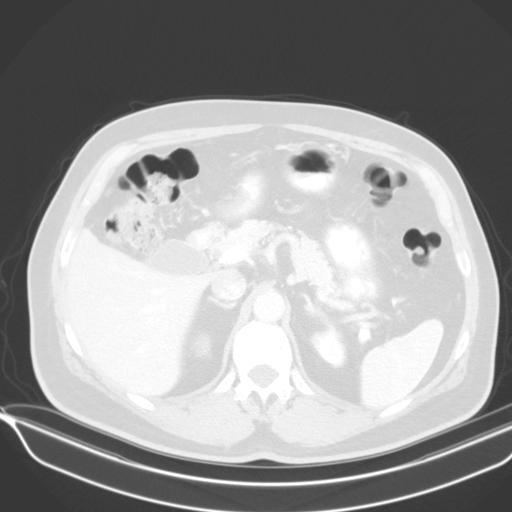
[im 80/99  lung]
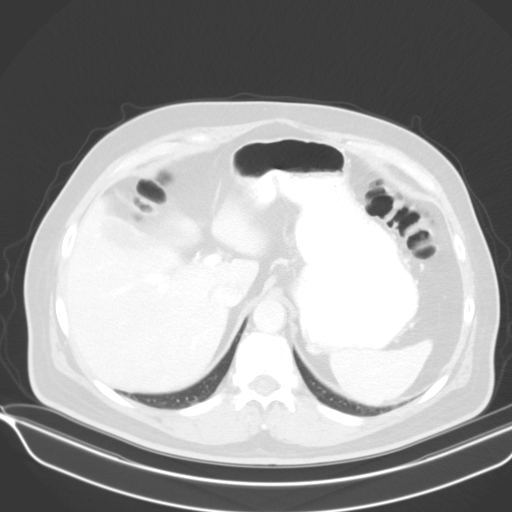
[im 86/99  soft-tissue]
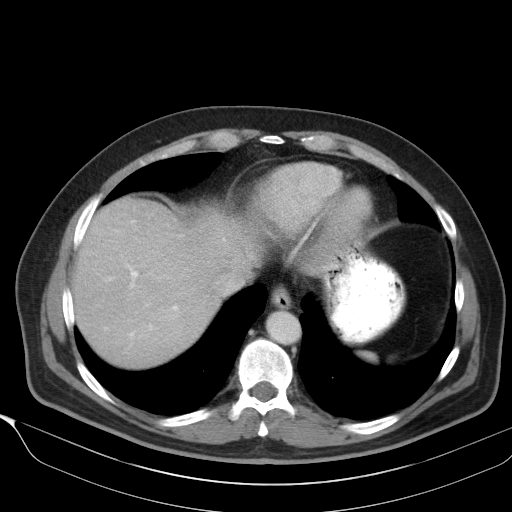
[im 86/99  lung]
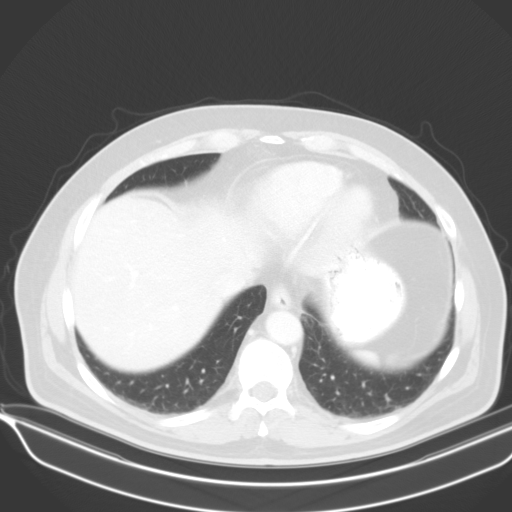
[im 92/99  soft-tissue]
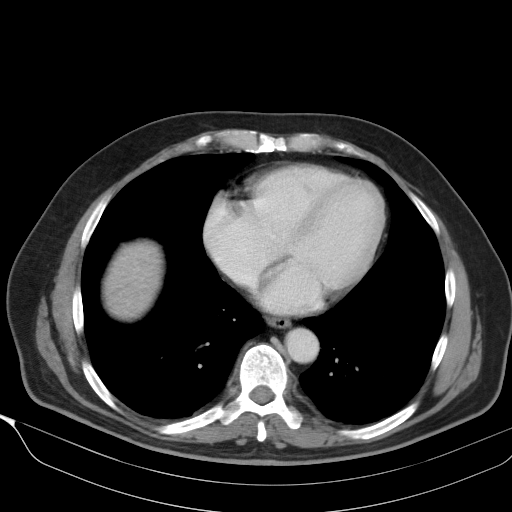
[im 92/99  lung]
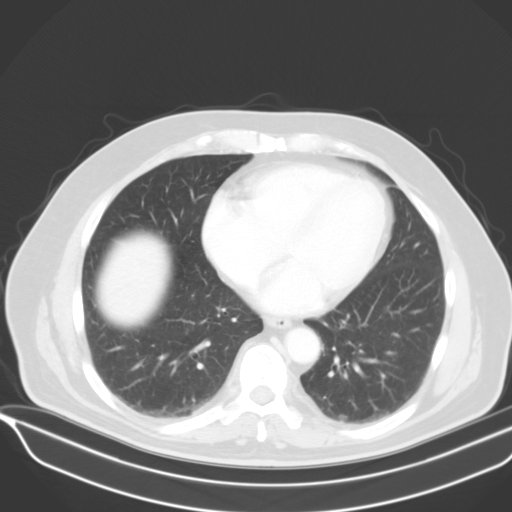

[13 of 32 positions shown; findings below may reference images not displayed]

RADIATION DOSE REDUCTION: This exam was performed according to the
departmental dose-optimization program which includes automated
exposure control, adjustment of the mA and/or kV according to
patient size and/or use of iterative reconstruction technique.

CONTRAST:  100mL UIP9LI-KSS IOPAMIDOL (UIP9LI-KSS) INJECTION 61%
FINDINGS: Lower chest: No acute abnormality.

Hepatobiliary: Liver is normal in size and contour. A few small
hepatic hypodensities identified measuring up to 13 mm in segment 2,
most likely cysts or hemangiomas. Gallbladder appears normal. No
biliary ductal dilatation identified.

Pancreas: Unremarkable. No pancreatic ductal dilatation or
surrounding inflammatory changes.

Spleen: Normal in size without focal abnormality.

Adrenals/Urinary Tract: Adrenal glands are normal. There is a
partially exophytic complex mass at the lower pole left kidney best
seen on the coronal view measuring up to 1.7 x 1.7 x 2.1 cm with
areas of thick nodular enhancement and enhancing septations within.
A few renal cortical cysts identified measuring up to 2 cm on the
right. No hydronephrosis identified bilaterally. Urinary bladder
appears within normal limits.

Stomach/Bowel: Small gastric diverticulum noted in the posterior
fundus. No bowel obstruction, free air or pneumatosis. Colonic
diverticulosis. No bowel wall edema identified. Appendix is normal.

Vascular/Lymphatic: Aortic atherosclerosis. Renal veins appear
normal. No enlarged abdominal or pelvic lymph nodes.

Reproductive: Prostate gland is markedly enlarged with nodular
protrusion into the base of the urinary bladder.

Other: No ascites.

Musculoskeletal: No suspicious bony lesions identified.
IMPRESSION: 1. Complex mass at the lower pole left kidney which is suspicious
for malignancy, at least Bosniak 3. Urology consultation
recommended.
2. No lymphadenopathy or evidence of renal vein involvement.
3. Colonic diverticulosis.
4. Other ancillary findings.

These results will be called to the ordering clinician or
representative by the Radiologist Assistant, and communication
documented in the PACS or [REDACTED].

## 2024-02-22 IMAGING — DX DG CHEST 2V
2 series · 2 of 2 positions shown · non-contrast
Comparison: None Available.

CLINICAL DATA: Renal neoplasm.

EXAM:
CHEST - 2 VIEW

[chest pa]
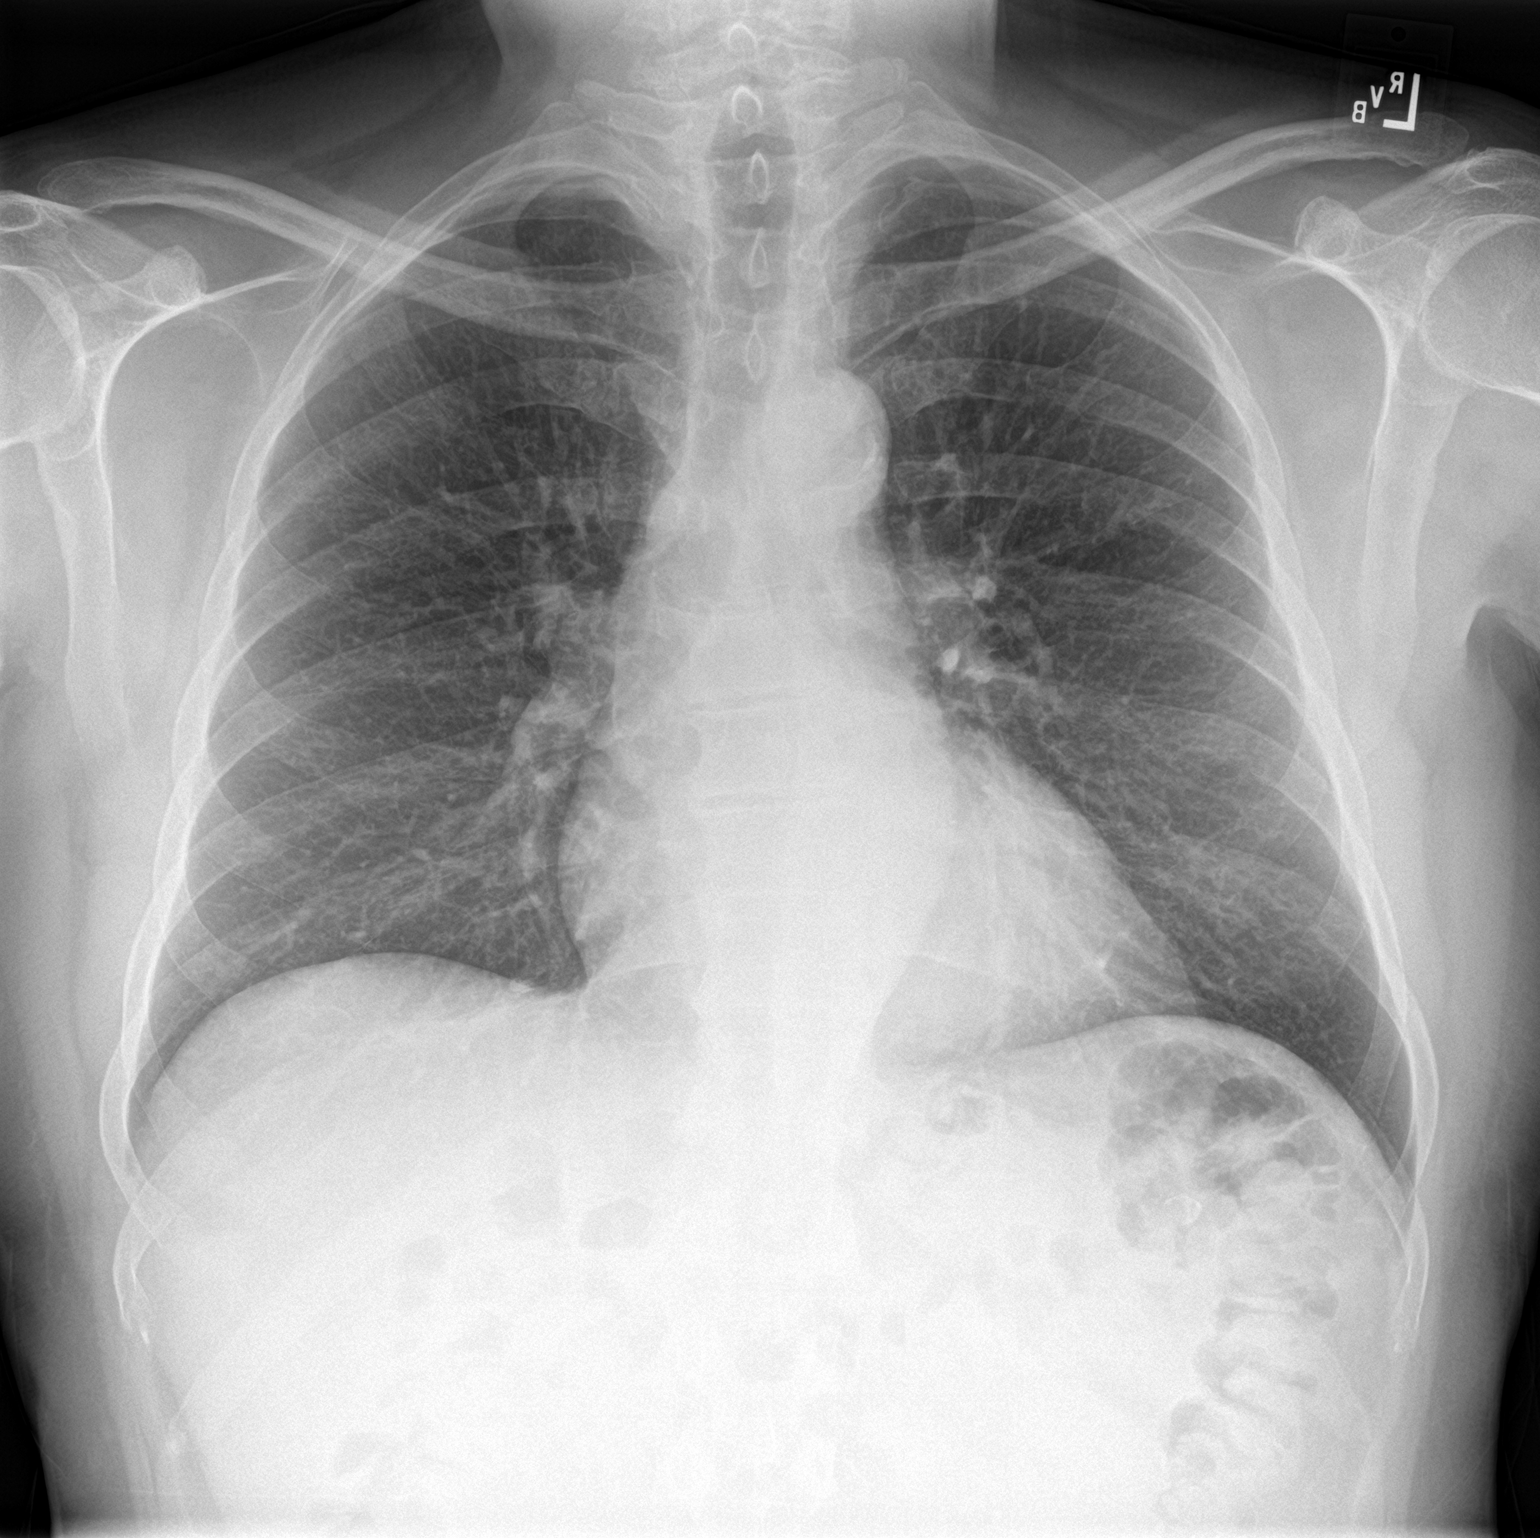

[chest lat]
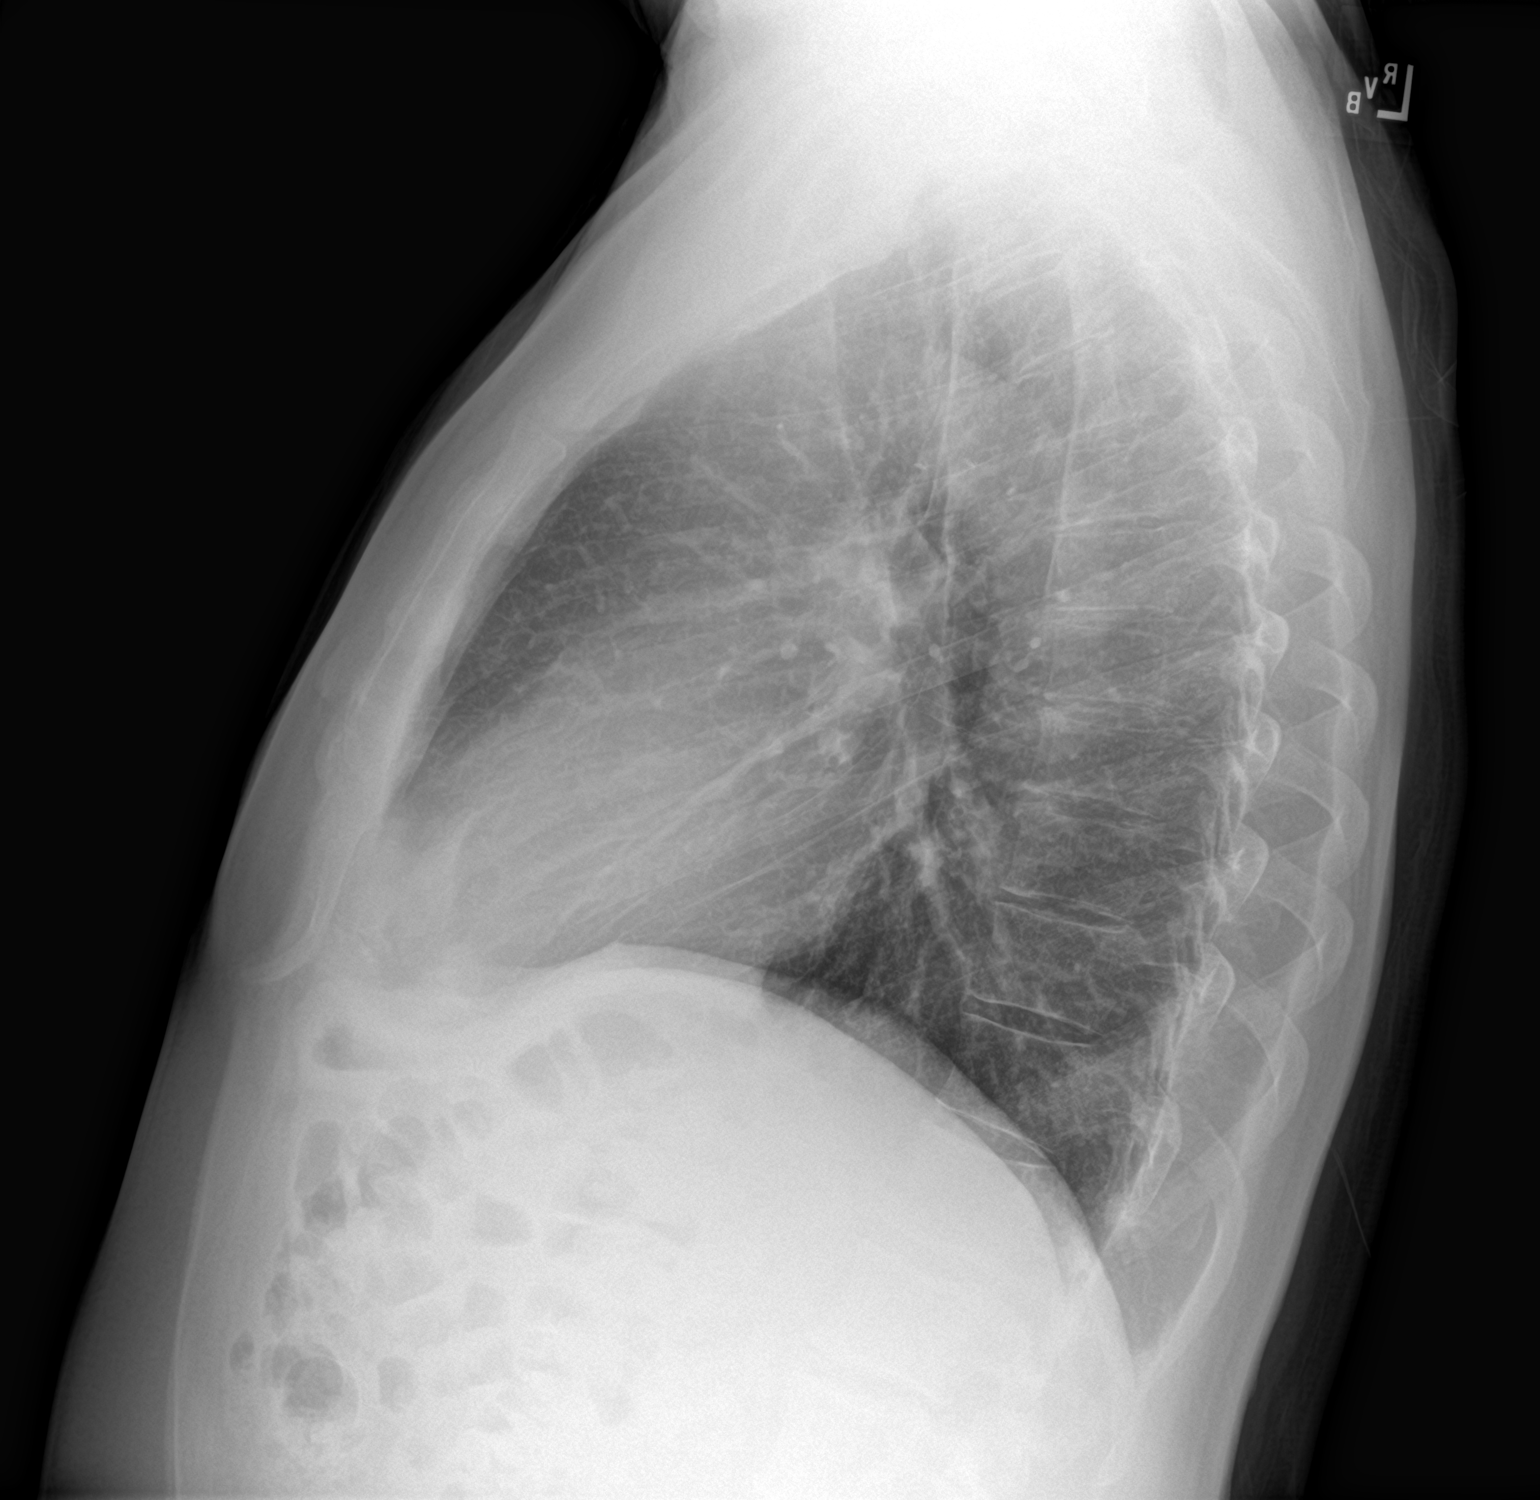

[2 of 2 positions shown; findings below may reference images not displayed]

FINDINGS: The heart size and mediastinal contours are within normal limits.
Both lungs are clear. The visualized skeletal structures are
unremarkable.
IMPRESSION: No active cardiopulmonary disease.
